# Patient Record
Sex: Male | Born: 1965
Health system: Southern US, Community
[De-identification: ages and names within clinical notes are randomized; demographics above are authoritative.]

## PROBLEM LIST (undated history)

## (undated) DIAGNOSIS — F329 Major depressive disorder, single episode, unspecified: Secondary | ICD-10-CM

## (undated) DIAGNOSIS — H6690 Otitis media, unspecified, unspecified ear: Secondary | ICD-10-CM

## (undated) DIAGNOSIS — K921 Melena: Secondary | ICD-10-CM

## (undated) DIAGNOSIS — K4090 Unilateral inguinal hernia, without obstruction or gangrene, not specified as recurrent: Secondary | ICD-10-CM

## (undated) DIAGNOSIS — K219 Gastro-esophageal reflux disease without esophagitis: Secondary | ICD-10-CM

## (undated) DIAGNOSIS — K589 Irritable bowel syndrome without diarrhea: Secondary | ICD-10-CM

## (undated) DIAGNOSIS — M549 Dorsalgia, unspecified: Secondary | ICD-10-CM

## (undated) DIAGNOSIS — G43009 Migraine without aura, not intractable, without status migrainosus: Secondary | ICD-10-CM

## (undated) DIAGNOSIS — J019 Acute sinusitis, unspecified: Secondary | ICD-10-CM

## (undated) DIAGNOSIS — J069 Acute upper respiratory infection, unspecified: Secondary | ICD-10-CM

## (undated) DIAGNOSIS — F411 Generalized anxiety disorder: Secondary | ICD-10-CM

## (undated) DIAGNOSIS — E785 Hyperlipidemia, unspecified: Secondary | ICD-10-CM

## (undated) DIAGNOSIS — J309 Allergic rhinitis, unspecified: Secondary | ICD-10-CM

## (undated) HISTORY — DX: Dorsalgia, unspecified: M54.9

## (undated) HISTORY — DX: Acute upper respiratory infection, unspecified: J06.9

## (undated) HISTORY — DX: Gastro-esophageal reflux disease without esophagitis: K21.9

## (undated) HISTORY — DX: Unilateral inguinal hernia, without obstruction or gangrene, not specified as recurrent: K40.90

## (undated) HISTORY — DX: Melena: K92.1

## (undated) HISTORY — DX: Otitis media, unspecified, unspecified ear: H66.90

## (undated) HISTORY — DX: Hyperlipidemia, unspecified: E78.5

## (undated) HISTORY — PX: OTHER SURGICAL HISTORY: SHX169

## (undated) HISTORY — DX: Major depressive disorder, single episode, unspecified: F32.9

## (undated) HISTORY — DX: Acute sinusitis, unspecified: J01.90

## (undated) HISTORY — DX: Irritable bowel syndrome without diarrhea: K58.9

## (undated) HISTORY — DX: Allergic rhinitis, unspecified: J30.9

## (undated) HISTORY — DX: Generalized anxiety disorder: F41.1

## (undated) HISTORY — DX: Migraine without aura, not intractable, without status migrainosus: G43.009

---

## 2003-03-08 ENCOUNTER — Emergency Department (HOSPITAL_COMMUNITY): Admission: EM | Admit: 2003-03-08 | Discharge: 2003-03-08 | Payer: Self-pay | Admitting: *Deleted

## 2004-07-28 ENCOUNTER — Ambulatory Visit: Payer: Self-pay | Admitting: Internal Medicine

## 2004-08-03 ENCOUNTER — Ambulatory Visit: Payer: Self-pay | Admitting: Internal Medicine

## 2004-09-02 ENCOUNTER — Ambulatory Visit: Payer: Self-pay | Admitting: Internal Medicine

## 2004-11-20 ENCOUNTER — Ambulatory Visit: Payer: Self-pay | Admitting: Internal Medicine

## 2004-12-16 ENCOUNTER — Ambulatory Visit: Payer: Self-pay | Admitting: Internal Medicine

## 2004-12-16 ENCOUNTER — Ambulatory Visit: Payer: Self-pay | Admitting: Cardiology

## 2005-01-01 ENCOUNTER — Ambulatory Visit: Payer: Self-pay | Admitting: Internal Medicine

## 2005-02-12 ENCOUNTER — Ambulatory Visit: Payer: Self-pay | Admitting: Internal Medicine

## 2005-04-13 ENCOUNTER — Ambulatory Visit: Payer: Self-pay | Admitting: Internal Medicine

## 2005-09-21 ENCOUNTER — Ambulatory Visit: Payer: Self-pay | Admitting: Internal Medicine

## 2006-02-11 ENCOUNTER — Ambulatory Visit: Payer: Self-pay | Admitting: Internal Medicine

## 2006-05-18 ENCOUNTER — Ambulatory Visit: Payer: Self-pay | Admitting: Internal Medicine

## 2006-11-09 ENCOUNTER — Ambulatory Visit: Payer: Self-pay | Admitting: Internal Medicine

## 2008-02-13 ENCOUNTER — Emergency Department (HOSPITAL_COMMUNITY): Admission: EM | Admit: 2008-02-13 | Discharge: 2008-02-13 | Payer: Self-pay | Admitting: Emergency Medicine

## 2008-02-14 ENCOUNTER — Ambulatory Visit: Payer: Self-pay | Admitting: Internal Medicine

## 2008-02-14 DIAGNOSIS — F329 Major depressive disorder, single episode, unspecified: Secondary | ICD-10-CM

## 2008-02-14 DIAGNOSIS — E785 Hyperlipidemia, unspecified: Secondary | ICD-10-CM

## 2008-02-14 DIAGNOSIS — K4090 Unilateral inguinal hernia, without obstruction or gangrene, not specified as recurrent: Secondary | ICD-10-CM | POA: Insufficient documentation

## 2008-02-14 DIAGNOSIS — J309 Allergic rhinitis, unspecified: Secondary | ICD-10-CM

## 2008-02-14 DIAGNOSIS — K589 Irritable bowel syndrome without diarrhea: Secondary | ICD-10-CM | POA: Insufficient documentation

## 2008-02-14 DIAGNOSIS — F411 Generalized anxiety disorder: Secondary | ICD-10-CM

## 2008-02-14 DIAGNOSIS — K219 Gastro-esophageal reflux disease without esophagitis: Secondary | ICD-10-CM | POA: Insufficient documentation

## 2008-02-14 DIAGNOSIS — F32A Depression, unspecified: Secondary | ICD-10-CM | POA: Insufficient documentation

## 2008-02-14 DIAGNOSIS — F3289 Other specified depressive episodes: Secondary | ICD-10-CM

## 2008-02-14 DIAGNOSIS — J3089 Other allergic rhinitis: Secondary | ICD-10-CM | POA: Insufficient documentation

## 2008-02-14 HISTORY — DX: Unilateral inguinal hernia, without obstruction or gangrene, not specified as recurrent: K40.90

## 2008-02-14 HISTORY — DX: Gastro-esophageal reflux disease without esophagitis: K21.9

## 2008-02-14 HISTORY — DX: Other specified depressive episodes: F32.89

## 2008-02-14 HISTORY — DX: Generalized anxiety disorder: F41.1

## 2008-02-14 HISTORY — DX: Allergic rhinitis, unspecified: J30.9

## 2008-02-14 HISTORY — DX: Major depressive disorder, single episode, unspecified: F32.9

## 2008-02-14 HISTORY — DX: Irritable bowel syndrome, unspecified: K58.9

## 2008-02-14 HISTORY — DX: Hyperlipidemia, unspecified: E78.5

## 2008-03-06 ENCOUNTER — Telehealth: Payer: Self-pay | Admitting: Internal Medicine

## 2008-03-22 ENCOUNTER — Ambulatory Visit (HOSPITAL_BASED_OUTPATIENT_CLINIC_OR_DEPARTMENT_OTHER): Admission: RE | Admit: 2008-03-22 | Discharge: 2008-03-22 | Payer: Self-pay | Admitting: General Surgery

## 2008-04-10 ENCOUNTER — Encounter: Payer: Self-pay | Admitting: Internal Medicine

## 2008-05-01 ENCOUNTER — Encounter: Payer: Self-pay | Admitting: Internal Medicine

## 2008-08-07 ENCOUNTER — Telehealth (INDEPENDENT_AMBULATORY_CARE_PROVIDER_SITE_OTHER): Payer: Self-pay | Admitting: *Deleted

## 2008-08-27 ENCOUNTER — Ambulatory Visit: Payer: Self-pay | Admitting: Internal Medicine

## 2008-08-27 DIAGNOSIS — J069 Acute upper respiratory infection, unspecified: Secondary | ICD-10-CM

## 2008-08-27 HISTORY — DX: Acute upper respiratory infection, unspecified: J06.9

## 2008-08-27 LAB — CONVERTED CEMR LAB

## 2008-10-15 ENCOUNTER — Ambulatory Visit: Payer: Self-pay | Admitting: Internal Medicine

## 2009-02-05 ENCOUNTER — Telehealth: Payer: Self-pay | Admitting: Internal Medicine

## 2009-02-18 ENCOUNTER — Ambulatory Visit: Payer: Self-pay | Admitting: Internal Medicine

## 2009-02-18 DIAGNOSIS — G43009 Migraine without aura, not intractable, without status migrainosus: Secondary | ICD-10-CM

## 2009-02-18 DIAGNOSIS — M549 Dorsalgia, unspecified: Secondary | ICD-10-CM | POA: Insufficient documentation

## 2009-02-18 HISTORY — DX: Dorsalgia, unspecified: M54.9

## 2009-02-18 HISTORY — DX: Migraine without aura, not intractable, without status migrainosus: G43.009

## 2009-07-31 ENCOUNTER — Telehealth: Payer: Self-pay | Admitting: Internal Medicine

## 2009-08-25 ENCOUNTER — Ambulatory Visit: Payer: Self-pay | Admitting: Internal Medicine

## 2009-11-05 ENCOUNTER — Ambulatory Visit: Payer: Self-pay | Admitting: Internal Medicine

## 2009-11-05 DIAGNOSIS — J019 Acute sinusitis, unspecified: Secondary | ICD-10-CM

## 2009-11-05 HISTORY — DX: Acute sinusitis, unspecified: J01.90

## 2010-03-02 ENCOUNTER — Ambulatory Visit: Payer: Self-pay | Admitting: Internal Medicine

## 2010-03-02 DIAGNOSIS — H6690 Otitis media, unspecified, unspecified ear: Secondary | ICD-10-CM | POA: Insufficient documentation

## 2010-03-02 HISTORY — DX: Otitis media, unspecified, unspecified ear: H66.90

## 2010-03-09 ENCOUNTER — Emergency Department (HOSPITAL_COMMUNITY): Admission: EM | Admit: 2010-03-09 | Discharge: 2010-03-11 | Payer: Self-pay | Admitting: Emergency Medicine

## 2010-03-10 ENCOUNTER — Telehealth: Payer: Self-pay | Admitting: Internal Medicine

## 2010-03-10 DIAGNOSIS — F329 Major depressive disorder, single episode, unspecified: Secondary | ICD-10-CM

## 2010-03-11 ENCOUNTER — Inpatient Hospital Stay (HOSPITAL_COMMUNITY): Admission: EM | Admit: 2010-03-11 | Discharge: 2010-03-12 | Payer: Self-pay | Admitting: Psychiatry

## 2010-03-11 ENCOUNTER — Ambulatory Visit: Payer: Self-pay | Admitting: Psychiatry

## 2010-03-13 ENCOUNTER — Ambulatory Visit: Payer: Self-pay | Admitting: Internal Medicine

## 2010-04-29 ENCOUNTER — Ambulatory Visit: Payer: Self-pay | Admitting: Internal Medicine

## 2010-04-29 DIAGNOSIS — K921 Melena: Secondary | ICD-10-CM | POA: Insufficient documentation

## 2010-04-29 HISTORY — DX: Melena: K92.1

## 2010-06-30 ENCOUNTER — Ambulatory Visit: Payer: Self-pay | Admitting: Psychiatry

## 2010-07-07 ENCOUNTER — Telehealth: Payer: Self-pay | Admitting: Internal Medicine

## 2010-08-06 ENCOUNTER — Ambulatory Visit: Payer: Self-pay | Admitting: Internal Medicine

## 2010-09-22 NOTE — Assessment & Plan Note (Signed)
Summary: PER PT TOMORROW APPT-RECTAL BLEEDING X FEW MTHS---ER/IF WORSE...   Vital Signs:  Patient profile:   45 year old male Height:      67 inches Weight:      176.50 pounds BMI:     27.74 O2 Sat:      96 % on Room air Temp:     97.9 degrees F oral Pulse rate:   84 / minute BP sitting:   150 / 100  (left arm) Cuff size:   regular  Vitals Entered By: Zella Ball Ewing CMA (AAMA) (April 29, 2010 3:05 PM)  O2 Flow:  Room air CC: Rectal Bleeding/RE   CC:  Rectal Bleeding/RE.  History of Present Illness: here to f/u;  overall depression better/improved on incr citalopram;  not so overwhelmed and less anxiety;  has  a part time job now that helps keep him busy;  also going to a free group therapy for bipolar and depressed persons ;  no suicidal ideation, no panic;  did have a tarful episode yesterday when thinking about current separation;  today here with 1 yr of recurrent rectal bleeding - BRB on tissue, seems to occur more when he has been constipated and straining at stool and more freq lately;  intermittent bleeding, only on tissue, may have had some dark stools but not sure if blood related;  has hx of diverticulitis and worried if might be related;  also BOTH parents with colon cancer (mother dx mar 2008 at 31yo, father dx - estranged);  does have rectal discomfort but only when bleeding occurs., and hard to get clean. No prior colonoscopy.   lost approx 15 lbs intentionally per pt iwth better diet since his recent involuntary committment episode  Problems Prior to Update: 1)  Otitis Media, Acute, Left  (ICD-382.9) 2)  Sinusitis- Acute-nos  (ICD-461.9) 3)  Back Pain  (ICD-724.5) 4)  Common Migraine  (ICD-346.10) 5)  Uri  (ICD-465.9) 6)  Ibs  (ICD-564.1) 7)  Hyperlipidemia  (ICD-272.4) 8)  Allergic Rhinitis  (ICD-477.9) 9)  Depression  (ICD-311) 10)  Anxiety  (ICD-300.00) 11)  Gerd  (ICD-530.81) 12)  Inguinal Hernia  (ICD-550.90)  Medications Prior to Update: 1)   Citalopram Hydrobromide 40 Mg  Tabs (Citalopram Hydrobromide) .Marland Kitchen.. 1 and 1/2 By Mouth Once Daily 2)  Alprazolam 1 Mg Tabs (Alprazolam) .Marland Kitchen.. 1 By Mouth Four Times Per Day As Needed As Needed  - To Fill March 02, 2010  Current Medications (verified): 1)  Citalopram Hydrobromide 40 Mg  Tabs (Citalopram Hydrobromide) .Marland Kitchen.. 1 and 1/2 By Mouth Once Daily 2)  Alprazolam 1 Mg Tabs (Alprazolam) .Marland Kitchen.. 1 By Mouth Four Times Per Day As Needed As Needed  - To Fill March 02, 2010  Allergies (verified): 1)  ! Pcn 2)  ! * Nexium  Past History:  Past Medical History: Last updated: 03/02/2010 GERD Anxiety Depression Allergic rhinitis Hyperlipidemia IBS borderline HTN migraine  Past Surgical History: Last updated: 08/27/2008 s/p RIH 7/09  Family History: Last updated: 04/29/2010 mother with colon cancer, father with colon cancer lung cancer - grandfather  DM HTN  Social History: Last updated: 04/29/2010 Former Smoker work - Occupational psychologist Married no children Alcohol use-yes Drug use-no  Risk Factors: Smoking Status: quit (02/14/2008)  Family History: Reviewed history from 02/14/2008 and no changes required. mother with colon cancer, father with colon cancer lung cancer - grandfather  DM HTN  Social History: Reviewed history from 03/02/2010 and no changes required. Former Smoker work - Occupational psychologist  Married no children Alcohol use-yes Drug use-no  Review of Systems       all otherwise negative per pt -    Physical Exam  General:  alert and overweight-appearing.   Head:  normocephalic and atraumatic.   Eyes:  vision grossly intact, pupils equal, and pupils round.   Ears:  R ear normal and L ear normal.   Nose:  no external deformity and no nasal discharge.   Mouth:  no gingival abnormalities and pharynx pink and moist.   Neck:  supple and no masses.   Lungs:  normal respiratory effort and normal breath sounds.   Heart:  normal rate and regular rhythm.   Abdomen:   soft, non-tender, and normal bowel sounds.   Rectal:  DRE heme neg;  has 6oclock fissure noted and hemorrhoidal tag Extremities:  no edema, no erythema  Skin:  color normal and no rashes.   Psych:  not depressed appearing and slightly anxious.     Impression & Recommendations:  Problem # 1:  HEMATOCHEZIA (ICD-578.1) prob fissure related, but given the FH should have colonscopy, which he simply cannot afford, and declines;  try anusol HC as needed  Problem # 2:  ANXIETY (ICD-300.00)  His updated medication list for this problem includes:    Citalopram Hydrobromide 40 Mg Tabs (Citalopram hydrobromide) .Marland Kitchen... 1 and 1/2 by mouth once daily    Alprazolam 1 Mg Tabs (Alprazolam) .Marland Kitchen... 1 by mouth four times per day as needed as needed  - to fill March 02, 2010 stable overall by hx and exam, ok to continue meds/tx as is   Complete Medication List: 1)  Citalopram Hydrobromide 40 Mg Tabs (Citalopram hydrobromide) .Marland Kitchen.. 1 and 1/2 by mouth once daily 2)  Alprazolam 1 Mg Tabs (Alprazolam) .Marland Kitchen.. 1 by mouth four times per day as needed as needed  - to fill March 02, 2010 3)  Anusol-hc 25 Mg Supp (Hydrocortisone acetate) .Marland Kitchen.. 1 pr two times a day  Patient Instructions: 1)  Please take all new medications as prescribed 2)  Continue all previous medications as before this visit  3)  Please call to have the colonoscopy ordered when you like 4)  Please schedule a follow-up appointment as needed. Prescriptions: ANUSOL-HC 25 MG SUPP (HYDROCORTISONE ACETATE) 1 pr two times a day  #10 x 1   Entered and Authorized by:   Corwin Levins MD   Signed by:   Corwin Levins MD on 04/29/2010   Method used:   Print then Give to Patient   RxID:   [id number]

## 2010-09-22 NOTE — Assessment & Plan Note (Signed)
Summary: refill rx-lb   Vital Signs:  Patient profile:   45 year old male Height:      66 inches Weight:      190.25 pounds BMI:     30.82 O2 Sat:      95 % on Room air Temp:     97.8 degrees F oral Pulse rate:   71 / minute BP sitting:   118 / 84  (left arm) Cuff size:   regular  Vitals Entered By: Zella Ball Ewing CMA Duncan Dull) (March 02, 2010 9:16 AM)  O2 Flow:  Room air  CC: refill on medications/RE   CC:  refill on medications/RE.  History of Present Illness: all of a sudden gained about 14 lbs since march with increased breads in the diet;  Pt denies CP, sob, doe, wheezing, orthopnea, pnd, worsening LE edema, palps, dizziness or syncope  Pt denies new neuro symptoms such as headache, facial or extremity weakness .  Denies polydipsia, polyuria; declines labs today due to cost.  Does have new left earache for 3 days with fever, general weakness and not feeling well.  No decreased hearing, vertigo or n/v.   Preventive Screening-Counseling & Management      Drug Use:  no.    Problems Prior to Update: 1)  Otitis Media, Acute, Left  (ICD-382.9) 2)  Sinusitis- Acute-nos  (ICD-461.9) 3)  Back Pain  (ICD-724.5) 4)  Common Migraine  (ICD-346.10) 5)  Uri  (ICD-465.9) 6)  Ibs  (ICD-564.1) 7)  Hyperlipidemia  (ICD-272.4) 8)  Allergic Rhinitis  (ICD-477.9) 9)  Depression  (ICD-311) 10)  Anxiety  (ICD-300.00) 11)  Gerd  (ICD-530.81) 12)  Inguinal Hernia  (ICD-550.90)  Medications Prior to Update: 1)  Citalopram Hydrobromide 40 Mg  Tabs (Citalopram Hydrobromide) .Marland Kitchen.. 1 By Mouth Once Daily 2)  Alprazolam 1 Mg Tabs (Alprazolam) .Marland Kitchen.. 1 By Mouth Four Times Per Day As Needed As Needed  - To Fil Sep 14, 2009 3)  Doxycycline Hyclate 100 Mg Caps (Doxycycline Hyclate) .Marland Kitchen.. 1 By Mouth Two Times A Day  Current Medications (verified): 1)  Citalopram Hydrobromide 40 Mg  Tabs (Citalopram Hydrobromide) .Marland Kitchen.. 1 By Mouth Once Daily 2)  Alprazolam 1 Mg Tabs (Alprazolam) .Marland Kitchen.. 1 By Mouth Four Times Per  Day As Needed As Needed  - To Fill March 02, 2010 3)  Doxycycline Hyclate 100 Mg Caps (Doxycycline Hyclate) .Marland Kitchen.. 1 By Mouth Two Times A Day  Allergies (verified): 1)  ! Pcn 2)  ! 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  , mild ill  Head:  normocephalic and atraumatic.   Eyes:  vision grossly intact, pupils equal, and pupils round.   Ears:  left tm with severe erythema, mild bulging ;  right tm ok, canals clear Nose:  nasal dischargemucosal pallor and mucosal edema.   Mouth:  pharyngeal erythema and fair dentition.   Neck:  supple and no masses.   Lungs:  normal respiratory effort and normal breath sounds.   Heart:  normal rate and regular rhythm.   Extremities:  no edema, no erythema    Impression & Recommendations:  Problem # 1:  OTITIS MEDIA, ACUTE, LEFT (ICD-382.9)  His updated medication list for this problem includes:    Doxycycline Hyclate 100 Mg Caps (Doxycycline hyclate) .Marland Kitchen... 1 by mouth two times a day treat as above, f/u any worsening signs or symptoms   Complete Medication List: 1)  Citalopram Hydrobromide 40 Mg Tabs (Citalopram hydrobromide) .Marland Kitchen.. 1 by mouth once daily 2)  Alprazolam 1 Mg Tabs (Alprazolam) .Marland Kitchen.. 1 by mouth four times per  day as needed as needed  - to fill March 02, 2010 3)  Doxycycline Hyclate 100 Mg Caps (Doxycycline hyclate) .Marland Kitchen.. 1 by mouth two times a day  Patient Instructions: 1)  Please take all new medications as prescribed 2)  Continue all previous medications as before this visit  3)  Please schedule a follow-up appointment in 1 year or sooner if needed Prescriptions: CITALOPRAM HYDROBROMIDE 40 MG  TABS (CITALOPRAM HYDROBROMIDE) 1 by mouth once daily  #90 x 3   Entered and Authorized by:   Corwin Levins MD   Signed by:   Corwin Levins MD on 03/02/2010   Method used:   Print then Give to Patient   RxID:   [id number] ALPRAZOLAM 1 MG TABS (ALPRAZOLAM) 1 by mouth four times per day as needed as needed  - to fill March 02, 2010  #120 x 5   Entered and Authorized by:   Corwin Levins MD   Signed by:   Corwin Levins MD on 03/02/2010   Method used:   Print then Give to Patient   RxID:   [id number] DOXYCYCLINE HYCLATE 100 MG CAPS (DOXYCYCLINE HYCLATE) 1 by mouth two times a day  #20 x 0   Entered and Authorized by:   Corwin Levins MD   Signed by:   Corwin Levins MD on 03/02/2010   Method used:   Print then Give to Patient   RxID:   [id number]

## 2010-09-22 NOTE — Progress Notes (Signed)
  Phone Note Other Incoming   Summary of Call: call from MD from [facility]  - pt's wife left him on sun july 17, pt left disturbing messages and pictures with noose like apparatus picture on facebook;  did not overdose on the xanax, but had ETOH binge last eveninv; friends brought him now to ER and he is to be involuntarily admitted;  I agreed with this assessment Initial call taken by: Corwin Levins MD,  March 10, 2010 4:27 PM

## 2010-09-22 NOTE — Assessment & Plan Note (Signed)
Summary: FOLLOW UP-NEED PRESCRIPTIONS REFILLED-LB   Vital Signs:  Patient profile:   45 year old male Height:      67 inches Weight:      176 pounds BMI:     27.67 O2 Sat:      97 % on Room air Temp:     97.8 degrees F oral Pulse rate:   66 / minute BP sitting:   112 / 82  (left arm) Cuff size:   regular  Vitals Entered ByMarland Kitchen Zella Ball Ewing (August 25, 2009 8:13 AM)  O2 Flow:  Room air  Preventive Care Screening     declines flu shot  CC: refill on meds/RE   CC:  refill on meds/RE.  History of Present Illness: no insurance today;  needs refills; feels overall stable without worsening depresive symtpoms or panic, overall good complaince and tolerating meds well.  ;Pt denies CP, sob, doe, wheezing, orthopnea, pnd, worsening LE edema, palps, dizziness or syncope  Pt denies new neuro symptoms such as headache, facial or extremity weakness   Problems Prior to Update: 1)  Back Pain  (ICD-724.5) 2)  Common Migraine  (ICD-346.10) 3)  Uri  (ICD-465.9) 4)  Ibs  (ICD-564.1) 5)  Hyperlipidemia  (ICD-272.4) 6)  Allergic Rhinitis  (ICD-477.9) 7)  Depression  (ICD-311) 8)  Anxiety  (ICD-300.00) 9)  Gerd  (ICD-530.81) 10)  Inguinal Hernia  (ICD-550.90)  Medications Prior to Update: 1)  Citalopram Hydrobromide 40 Mg  Tabs (Citalopram Hydrobromide) .Marland Kitchen.. 1 By Mouth Once Daily 2)  Alprazolam 1 Mg Tabs (Alprazolam) .Marland Kitchen.. 1 By Mouth Four Times Per Day As Needed As Needed  - To Fil Aug 15, 2009  Current Medications (verified): 1)  Citalopram Hydrobromide 40 Mg  Tabs (Citalopram Hydrobromide) .Marland Kitchen.. 1 By Mouth Once Daily 2)  Alprazolam 1 Mg Tabs (Alprazolam) .Marland Kitchen.. 1 By Mouth Four Times Per Day As Needed As Needed  - To Fil Aug 15, 2009  Allergies (verified): 1)  ! Pcn 2)  ! 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   Head:  normocephalic and atraumatic.   Eyes:  vision grossly intact and pupils equal.   Ears:  R ear normal and L ear normal.   Nose:  no external deformity and no nasal discharge.   Mouth:  no gingival abnormalities and pharynx pink and moist.   Neck:  supple and no masses.   Lungs:  normal respiratory effort and no intercostal retractions.   Heart:  normal rate and regular rhythm.   Abdomen:  soft, non-tender, and normal bowel sounds.   Msk:  no joint tenderness and no joint swelling.   Extremities:  no edema, no erythema    Impression & Recommendations:  Problem # 1:  ANXIETY (ICD-300.00)  His updated medication list for this problem includes:    Citalopram Hydrobromide 40 Mg Tabs (Citalopram hydrobromide) .Marland Kitchen... 1 by mouth once daily    Alprazolam 1 Mg Tabs (Alprazolam) .Marland Kitchen... 1 by mouth four times per day as needed as needed  - to fil Aug 15, 2009 stable overall  by hx and exam, ok to continue meds/tx as is   Complete Medication List: 1)  Citalopram Hydrobromide 40 Mg Tabs (Citalopram hydrobromide) .Marland Kitchen.. 1 by mouth once daily 2)  Alprazolam 1 Mg Tabs (Alprazolam) .Marland Kitchen.. 1 by mouth four times per day as needed as needed  - to fil Aug 15, 2009  Patient Instructions: 1)  Continue all previous medications as before this visit  2)  Please schedule a follow-up appointment in 6 months or sooner if needed Prescriptions: ALPRAZOLAM 1 MG TABS (ALPRAZOLAM) 1 by mouth four times per day as needed as needed  - to fil Aug 15, 2009  #120 x 5   Entered and Authorized by:   Corwin Levins MD   Signed by:   Corwin Levins MD on 08/25/2009   Method used:   Print then Give to Patient    RxID:   [id number]

## 2010-09-22 NOTE — Progress Notes (Signed)
Summary: REQ FOR ABX  Phone Note Call from Patient Call back at [facility] Phone [phone]   Caller: Patient Summary of Call: Pt called requesting Rx for sinus infection. Pt states that he cannot afford OV right now. Please advise [facility] Initial call taken by: Margaret Pyle, CMA,  July 07, 2010 9:32 AM  Follow-up for Phone Call        sorry, office policy is for OV to determine if antibx are needed  please consider urgent care or ER Follow-up by: Corwin Levins MD,  July 07, 2010 12:13 PM  Additional Follow-up for Phone Call Additional follow up Details #1::        Pt informed  Additional Follow-up by: Lamar Sprinkles, CMA,  July 07, 2010 2:53 PM

## 2010-09-22 NOTE — Assessment & Plan Note (Signed)
Summary: ??sinus inf/cd   Vital Signs:  Patient profile:   45 year old male Height:      66 inches Weight:      176 pounds BMI:     28.51 O2 Sat:      96 % on Room air Temp:     97.4 degrees F oral Pulse rate:   76 / minute BP sitting:   130 / 90  (left arm) Cuff size:   regular  Vitals Entered ByZella Ball Ewing (November 05, 2009 4:05 PM)  O2 Flow:  Room air CC: Sinus congestion and pressure/RE   CC:  Sinus congestion and pressure/RE.  History of Present Illness: here with 2 days onset midl to mod facial pain (worse to the glabellar region), fever, greenish d/c and pressure with mild ST;  Pt denies CP, sob, doe, wheezing, orthopnea, pnd, worsening LE edema, palps, dizziness or syncope   Problems Prior to Update: 1)  Sinusitis- Acute-nos  (ICD-461.9) 2)  Back Pain  (ICD-724.5) 3)  Common Migraine  (ICD-346.10) 4)  Uri  (ICD-465.9) 5)  Ibs  (ICD-564.1) 6)  Hyperlipidemia  (ICD-272.4) 7)  Allergic Rhinitis  (ICD-477.9) 8)  Depression  (ICD-311) 9)  Anxiety  (ICD-300.00) 10)  Gerd  (ICD-530.81) 11)  Inguinal Hernia  (ICD-550.90)  Medications Prior to Update: 1)  Citalopram Hydrobromide 40 Mg  Tabs (Citalopram Hydrobromide) .Marland Kitchen.. 1 By Mouth Once Daily 2)  Alprazolam 1 Mg Tabs (Alprazolam) .Marland Kitchen.. 1 By Mouth Four Times Per Day As Needed As Needed  - To Fil Sep 14, 2009  Current Medications (verified): 1)  Citalopram Hydrobromide 40 Mg  Tabs (Citalopram Hydrobromide) .Marland Kitchen.. 1 By Mouth Once Daily 2)  Alprazolam 1 Mg Tabs (Alprazolam) .Marland Kitchen.. 1 By Mouth Four Times Per Day As Needed As Needed  - To Fil Sep 14, 2009 3)  Doxycycline Hyclate 100 Mg Caps (Doxycycline Hyclate) .Marland Kitchen.. 1 By Mouth Two Times A Day  Allergies (verified): 1)  ! Pcn 2)  ! 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  , mild ill  Head:  normocephalic and atraumatic.   Eyes:  vision grossly intact, pupils equal, and pupils round.   Ears:  bilat tm's red, sinus marked tender glabellar region adn less so the max sinus areas Nose:  nasal dischargemucosal pallor and mucosal edema.   Mouth:  pharyngeal erythema and fair dentition.   Neck:  supple and cervical lymphadenopathy.   Lungs:  normal respiratory effort and normal breath sounds.   Heart:  normal rate and regular rhythm.   Extremities:  no edema, no erythema    Impression & Recommendations:  Problem # 1:  SINUSITIS- ACUTE-NOS (ICD-461.9)  His updated medication list for this problem includes:    Doxycycline Hyclate 100 Mg Caps (Doxycycline hyclate) .Marland Kitchen... 1 by mouth two times a day treat as above, f/u any worsening signs or symptoms   Problem # 2:  ALLERGIC RHINITIS (ICD-477.9) for OTC claritin as needed   Complete Medication List: 1)  Citalopram Hydrobromide 40 Mg Tabs (Citalopram hydrobromide) .Marland Kitchen.. 1 by  mouth once daily 2)  Alprazolam 1 Mg Tabs (Alprazolam) .Marland Kitchen.. 1 by mouth four times per day as needed as needed  - to fil Sep 14, 2009 3)  Doxycycline Hyclate 100 Mg Caps (Doxycycline hyclate) .Marland Kitchen.. 1 by mouth two times a day  Patient Instructions: 1)  Please take all new medications as prescribed 2)  Continue all previous medications as before this visit  3)  You can also use Mucinex OTC or it's generic for congestion  4)  Please schedule a follow-up appointment as needed. Prescriptions: DOXYCYCLINE HYCLATE 100 MG CAPS (DOXYCYCLINE HYCLATE) 1 by mouth two times a day  #20 x 0   Entered and Authorized by:   Corwin Levins MD   Signed by:   Corwin Levins MD on 11/05/2009    Method used:   Print then Give to Patient   RxID:   [id number]
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
## 2010-11-07 LAB — RAPID URINE DRUG SCREEN, HOSP PERFORMED
Amphetamines: NOT DETECTED
Benzodiazepines: POSITIVE — AB
Cocaine: NOT DETECTED
Tetrahydrocannabinol: POSITIVE — AB

## 2010-11-07 LAB — BASIC METABOLIC PANEL
Calcium: 9.4 mg/dL (ref 8.4–10.5)
GFR calc non Af Amer: >60 mL/min (ref >60)
Potassium: 3.8 mEq/L (ref 3.5–5.1)
Sodium: 143 mEq/L (ref 135–145)

## 2010-11-07 LAB — DIFFERENTIAL
Basophils Absolute: 0.2 10*3/uL — ABNORMAL HIGH (ref 0.0–0.1)
Basophils Relative: 2 % — ABNORMAL HIGH (ref 0–1)
Eosinophils Absolute: 0.1 10*3/uL (ref 0.0–0.7)
Eosinophils Relative: 1 % (ref 0–5)
Lymphs Abs: 3.5 10*3/uL (ref 0.7–4.0)

## 2010-11-07 LAB — URINALYSIS, ROUTINE W REFLEX MICROSCOPIC
Glucose, UA: NEGATIVE mg/dL
Nitrite: NEGATIVE
Protein, ur: NEGATIVE mg/dL
Urobilinogen, UA: 0.2 mg/dL (ref 0.0–1.0)

## 2010-11-07 LAB — CBC
Hemoglobin: 16.6 g/dL (ref 13.0–17.0)
MCH: 31.8 pg (ref 26.0–34.0)
MCHC: 34.4 g/dL (ref 30.0–36.0)
Platelets: 300 10*3/uL (ref 150–400)
RBC: 5.21 MIL/uL (ref 4.22–5.81)
WBC: 10.3 10*3/uL (ref 4.0–10.5)

## 2011-01-05 NOTE — Op Note (Signed)
NAME:  Zachary Riley, DAIS NO.:  [id number]   MEDICAL RECORD NO.:  [id number]          PATIENT TYPE:  AMB   LOCATION:  DSC                          FACILITY:  [facility]   PHYSICIAN:  Gabrielle Dare. Janee Morn, M.D.DATE OF BIRTH:  12-19-1965   DATE OF PROCEDURE:  DATE OF DISCHARGE:                               OPERATIVE REPORT   PREOPERATIVE DIAGNOSIS:  Right inguinal hernia.   POSTOPERATIVE DIAGNOSIS:  Right inguinal hernia.   PROCEDURE:  Repair of right inguinal hernia with mesh.   SURGEON:  Gabrielle Dare. Janee Morn, MD   ANESTHESIA:  General.   HISTORY OF PRESENT ILLNESS:  Mr. Zachary Riley is a 45 year old gentleman who  had an acute onset of right inguinal pain and development of a lump the  last month.  I evaluated him in the office for a suspected hernia and  found a right inguinal hernia.  It tends to spontaneously reduce, but he  has had some intermittent pain over the past few weeks.  He presents  today for elective repair.   PROCEDURE IN DETAIL:  Informed consent was obtained.  The patient was  identified in the preop holding area.  His site was marked.  He received  intravenous antibiotics.  He was brought to the operating room.  General  anesthesia with laryngeal mask airway was administered by the anesthesia  staff.  His lower abdomen and groins were prepped and draped in the  sterile fashion.  Marcaine 0.25% with epinephrine was injected out  towards the right anterior-superior iliac spine and then along the right  inguinal region at the site of pending incision.  Right inguinal  incision was made.  Subcutaneous tissues were dissected down through  Scarpa fascia revealing external oblique fascia.  Hemostasis was  obtained with Bovie cautery.  The external oblique fascia was somewhat  attenuated medially.  It was divided laterally and this was continued  down into the stretched out ring.  The superior leaflet of the external  oblique was dissected free off the  transversalis and the inferior  leaflet was dissected down, revealing the shelving edge of inguinal  ligament.  Cord structures were encircled with a Penrose drain.  The  patient did have the inguinal branch with ilioinguinal nerve which was  in a location that was in danger of having entrapment with the mesh, so  this was not primarily divided and the segment was removed.  The cord  was then dissected.  There was a small indirect hernia sac.  This was  dissected down to the base where it exited at the internal ring and all  contents was ensured to be reduced and it was high ligated with a 2-0  Vicryl suture and excised.  The floor was a little bit weak, but there  was no large direct hernia present.  The hernia repair was then  completed with a polypropylene keyhole mesh to cut to size.  This was  secured medially to the tissues over the pubic tubercle with 2-0 Prolene  and it was secured in a running fashion along the shelving edge of  inguinal ligament  inferiorly with running 2-0 Prolene.  It was secured  then with interrupted 2-0 Prolene along the tissues over the pubic  tubercle superiorly and then extending out superiorly and laterally  along and primarily sewing the mesh to the transversalis.  The 2  leaflets of the mesh were rejoined reconstructing his ring.  They were  stitched together into the underlying musculature.  The mesh did extend  out laterally beneath the external oblique fascia.  These 2 leaflets  were tacked together and down to the underlying musculature with several  interrupted 2-0 Prolene sutures.  Meticulous hemostasis was obtained.  Additional local anesthetic was injected.  The external oblique was then  closed with a running 2-0 Vicryl suture.  The subcutaneous tissues were  irrigated.  Hemostasis was again ensured.  Scarpa fascia was closed with  interrupted 3-0 Vicryl sutures.  The skin was closed with running 4-0  Monocryl subcuticular stitch.  Sponge,  needle, and  instrument counts were all correct.  Benzoin, Steri-Strips, and sterile  dressings were applied.  The patient tolerated the procedure well  without apparent complication.  His right testicle was returned to  anatomic position in his scrotum at the completion of the procedure, and  he was taken to the recovery room in stable condition.      Gabrielle Dare Janee Morn, M.D.  Electronically Signed     BET/MEDQ  D:  03/22/2008  T:  03/23/2008  Job:  [id number]   cc:   Corwin Levins, MD

## 2011-01-08 ENCOUNTER — Encounter: Payer: Self-pay | Admitting: Internal Medicine

## 2011-01-08 ENCOUNTER — Ambulatory Visit (INDEPENDENT_AMBULATORY_CARE_PROVIDER_SITE_OTHER): Payer: Self-pay | Admitting: Internal Medicine

## 2011-01-08 VITALS — BP 120/88 | HR 74 | Temp 97.5°F | Ht 67.0 in | Wt 167.2 lb

## 2011-01-08 DIAGNOSIS — F411 Generalized anxiety disorder: Secondary | ICD-10-CM

## 2011-01-08 DIAGNOSIS — F329 Major depressive disorder, single episode, unspecified: Secondary | ICD-10-CM

## 2011-01-08 MED ORDER — ALPRAZOLAM 1 MG PO TABS: 1.0000 mg | ORAL_TABLET | Freq: Four times a day (QID) | ORAL | Status: DC | PRN

## 2011-01-08 MED ORDER — CITALOPRAM HYDROBROMIDE 40 MG PO TABS: ORAL_TABLET | ORAL | Status: DC

## 2011-01-08 NOTE — Assessment & Plan Note (Signed)
[facility]                             PULMONARY OFFICE NOTE   ASHTEN, PRATS                      MRN:          [id number]  DATE:10/30/2006                            DOB:          04-28-1966    HISTORY OF PRESENT ILLNESS:  A 45 year old white male with multiple  complaints today. He was seen at an urgent care for a 1 month history of  persistent sensation of sore throat, for which he was placed on a Z-  pack, which did not good. It turns out that he ran out of Nasacort,  for which he was supposed to be maintaining himself on a daily basis  with the option to increase to b.i.d. for exacerbations with Afrin use  for the first 5 days (see previous discussion of this on December 16, 2004.) He is not following his instructions. He has been coughing up  every morning, brown sputum but having no fever or dyspnea, pleuritic  pain, orthopnea, PND, or leg swelling, or overt reflux symptoms (he  states he is taking Aciphex regularly.)   PHYSICAL EXAMINATION:  GENERAL:  He is an anxious white male in no acute  distress.  VITAL SIGNS:  Afebrile.  Blood pressure 134/96. Hemoglobin saturation  97% on room air.  HEENT:  Unremarkable. Oropharynx is clear.  NECK:  Supple without cervical adenopathy or tenderness. Trachea is  midline.  LUNGS:  Fields reveal minimal rhonchi bilaterally  Overall air movement  is adequate.  CARDIOVASCULAR:  Regular rhythm. Without murmur, rub, or gallop.  ABDOMEN:  Soft, benign.  EXTREMITIES:  Warm without calf tenderness, cyanosis, or clubbing.   IMPRESSION/RECOMMENDATIONS:  1. Poorly controlled rhinitis, now with possible sinusitis. This is      related to post-nasal drip syndrome and chronic sore throat and      hoarseness. I recommended Augmentin 875 b.i.d. for 10 days, Mucinex      DM b.i.d. p.r.n. cough, and Advair Cold & Sinus p.r.n.      congestion. I have also advised him to have a sinus CT scan if      not improved  by the end of 10 days and gave him a phone number to      call.  2. Chronic anxiety. The record indicates that he is supposed to be on      Citalopram 40 mg 1 daily with p.r.n. Xanax but actually, he has      been using Xanax up to 4 times daily on a regular basis and I don't      see any requests for refills for the citalopram.  I have given him      a limited prescription and will ask him to identify a psychologist      from his insurance plan to consider referring him.  3. Chronic left ear ulceration. I recommended dermatology followup but      will need to arrange for this through his insurance plan.  4. Situational hypertension. I have advised the patient to return for      followup within the next 3 months for  comprehensive health care      evaluation. Will see him sooner if needed.     Charlaine Dalton. Sherene Sires, MD, [facility]  Electronically Signed    MBW/MedQ  DD: 11/09/2006  DT: 11/09/2006  Job #: [id number]

## 2011-01-08 NOTE — Assessment & Plan Note (Signed)
[facility]                               PULMONARY OFFICE NOTE   DELNO, BLAISDELL                      MRN:          [id number]  DATE:05/18/2006                            DOB:          1965-12-29    HISTORY:  This is a 45 year old white male last seen here for anxiety and  depression related to his mother's death, but comes in today acutely ill for  a week with nasal congestion with a stuffy head sensation, right ear pain,  and decreased hearing on the right.  He denies any purulent secretions, sore  throat, fever, headache, chest pain, myalgias, or arthralgias.   The chart indicates he is allergic to penicillin.  He tells me this caused  him to be sick on his stomach, but subsequently was able to take  ampicillin without difficulty.   MEDICATIONS:  Reviewed with him in detail and on the face sheet dated  May 18, 2006.  Note:  He is not consistent about using Nasacort or  AcipHex due to insurance issues.   PHYSICAL EXAMINATION:  He is a pleasant ambulatory white male in no acute  distress.  He had stable vital signs.  HEENT:  Severe turbinate edema bilaterally, right greater than left.  He  also has right greater than left severe external otitis.  Neck supple  without cervical adenopathy or tenderness.  Dentition is intact.  LUNGS:  The lung fields are clear bilaterally to auscultation and  percussion.  CARDIAC:  Regular rate and rhythm without murmur, gallop, or rub.  ABDOMEN:  Soft, benign.  EXTREMITIES:  Warm without calf tenderness, cyanosis, clubbing, or edema.   IMPRESSION:  Extended discussion with this patient lasting 15-20 minutes of  a 25 minute visit on the following topics.  1. First, he carries the diagnosis of chronic rhinitis and is not      consistent about using Nasacort and has not instituted the p.r.n. list      of medicines for acute nasal congestion.  I reviewed these with him      again today in detail,  including the use of Afrin and Advil Cold and      Sinus for decongesting purposes.  I gave him samples of Nasacort and      also prescription for a year.  2. A history of reflux for which he has been maintained on AcipHex at Dr.      Ardell Isaacs recommendation.  I gave him refills for this.  I emphasized      that reflux may actually contribute to sinus disease and needs to be      treated as maintenance therapy as well per Dr. Ardell Isaacs recommendation.  3. He has acute external otitis.  I have given him recommendations      regarding ear hygiene and recommended Corticosporin otic suspension 2      drops q.i.d.  4. Finally, he does appear to have possible early sinusitis with a      question of penicillin allergy.  He assures me he is able to take  amoxicillin without difficulty, and therefore, we gave him      Augmentin 875 b.i.d. for 10 days and then sinus CT scan if not 100%      back to baseline in terms of all of his acute complaints.            ______________________________  Charlaine Dalton. Sherene Sires, MD, [facility]      MBW/MedQ  DD:  05/18/2006  DT:  05/19/2006  Job #:  [id number]

## 2011-01-08 NOTE — Patient Instructions (Signed)
Continue all other medications as before Please return in 1 year for your yearly visit, or sooner if needed 

## 2011-01-08 NOTE — Progress Notes (Signed)
  Subjective:    Patient ID: Zachary Riley, male    DOB: 1966-02-03, 45 y.o.   MRN: [id number]  HPI Here to f/u;  States he is wondering if he has a splinter wooden to the web space left hand between thumb and finger after doing some wood working.  Some uncomfortable but not sure he can actually feel a splinter.  Pt denies chest pain, increased sob or doe, wheezing, orthopnea, PND, increased LE swelling, palpitations, dizziness or syncope.  Pt denies new neurological symptoms such as new headache, or facial or extremity weakness or numbness   Pt denies polydipsia, polyuria, Wt down from 186 to 167 with better diet and exercise since July 2011.  No smoking over 10 yrs.  Wife left last summer, for finalize divorce this summer. Denies worsening depressive symptoms, suicidal ideation, or panic, though has ongoing anxiety, not increased recently.  Past Medical History  Diagnosis Date  . ALLERGIC RHINITIS 02/14/2008  . ANXIETY 02/14/2008  . BACK PAIN 02/18/2009  . COMMON MIGRAINE 02/18/2009  . DEPRESSION 02/14/2008  . GERD 02/14/2008  . HEMATOCHEZIA 04/29/2010  . HYPERLIPIDEMIA 02/14/2008  . IBS 02/14/2008  . INGUINAL HERNIA 02/14/2008  . OTITIS MEDIA, ACUTE, LEFT 03/02/2010  . SINUSITIS- ACUTE-NOS 11/05/2009  . URI 08/27/2008   Past Surgical History  Procedure Date  . S/p rih 7/09     reports that he has quit smoking. He does not have any smokeless tobacco history on file. He reports that he drinks alcohol. He reports that he does not use illicit drugs. family history includes Cancer in his father, mother, and other; Diabetes in his other; and Hypertension in his other. Allergies  Allergen Reactions  . Esomeprazole Magnesium     REACTION: per patient  . Penicillins     REACTION: GI upset   No current outpatient prescriptions on file prior to visit.   Review of Systems All otherwise neg per pt     Objective:   Physical Exam BP 120/88  Pulse 74  Temp(Src) 97.5 F (36.4 C) (Oral)  Ht 5\' 7"   (1.702 m)  Wt 167 lb 4 oz (75.864 kg)  BMI 26.20 kg/m2  SpO2 98% Physical Exam  VS noted Constitutional: Pt appears well-developed and well-nourished.  HENT: Head: Normocephalic.  Right Ear: External ear normal.  Left Ear: External ear normal.  Eyes: Conjunctivae and EOM are normal. Pupils are equal, round, and reactive to light.  Neck: Normal range of motion. Neck supple.  Cardiovascular: Normal rate and regular rhythm.   Pulmonary/Chest: Effort normal and breath sounds normal.  Abd:  Soft, NT, non-distended, + BS Neurological: Pt is alert. No cranial nerve deficit.  Skin: Skin is warm. No erythema.  No spllinter palpable to skin left hand web space Psychiatric: Pt behavior is normal. Thought content normal. 1+ nervous, not depressed appearing        Assessment & Plan:

## 2011-01-09 ENCOUNTER — Encounter: Payer: Self-pay | Admitting: Internal Medicine

## 2011-01-09 NOTE — Assessment & Plan Note (Signed)
stable overall by hx and exam, most recent lab reviewed with pt, and pt to continue medical treatment as before 

## 2011-01-09 NOTE — Assessment & Plan Note (Signed)
stable overall by hx and exam, most recent lab reviewed with pt, and pt to continue medical treatment as before  Lab Results  Component Value Date   WBC 10.3 03/09/2010   HGB 16.6 03/09/2010   HCT 48.2 03/09/2010   PLT 300 03/09/2010   NA 143 03/09/2010   K 3.8 03/09/2010   CL 108 03/09/2010   CREATININE 0.92 03/09/2010   BUN 10 03/09/2010   CO2 24 03/09/2010   PSA declined 08/27/2008

## 2011-02-16 ENCOUNTER — Other Ambulatory Visit (INDEPENDENT_AMBULATORY_CARE_PROVIDER_SITE_OTHER): Payer: Self-pay

## 2011-02-16 ENCOUNTER — Telehealth: Payer: Self-pay

## 2011-02-16 DIAGNOSIS — R635 Abnormal weight gain: Secondary | ICD-10-CM

## 2011-02-16 LAB — CBC WITH DIFFERENTIAL/PLATELET
Basophils Absolute: 0.1 10*3/uL (ref 0.0–0.1)
HCT: 46.9 % (ref 39.0–52.0)
Lymphs Abs: 4.1 10*3/uL — ABNORMAL HIGH (ref 0.7–4.0)
Monocytes Relative: 6.5 % (ref 3.0–12.0)
Neutrophils Relative %: 63.6 % (ref 43.0–77.0)
Platelets: 285 10*3/uL (ref 150.0–400.0)
RDW: 13.5 % (ref 11.5–14.6)
WBC: 14.3 10*3/uL — ABNORMAL HIGH (ref 4.5–10.5)

## 2011-02-16 LAB — BASIC METABOLIC PANEL
CO2: 25 mEq/L (ref 19–32)
Calcium: 9.5 mg/dL (ref 8.4–10.5)
GFR: 107.17 mL/min (ref >60.00)
Potassium: 3.7 mEq/L (ref 3.5–5.1)
Sodium: 146 mEq/L — ABNORMAL HIGH (ref 135–145)

## 2011-02-16 LAB — URINALYSIS
Specific Gravity, Urine: >=1.03 (ref 1.000–1.030)
Urine Glucose: NEGATIVE
pH: 5.5 (ref 5.0–8.0)

## 2011-02-16 LAB — HEPATIC FUNCTION PANEL
AST: 20 U/L (ref 0–37)
Alkaline Phosphatase: 70 U/L (ref 39–117)
Bilirubin, Direct: 0.1 mg/dL (ref 0.0–0.3)

## 2011-02-16 NOTE — Telephone Encounter (Signed)
Pt advised, labs scheduled

## 2011-02-16 NOTE — Telephone Encounter (Signed)
Pt called requesting to have labs done as he declined at last OV. Per pt he has been losing weight and "not feeling himself lately" pt also mentions that both his parents have has cancer. Please advise?

## 2011-02-16 NOTE — Telephone Encounter (Signed)
Ok for labs with respect to recent wt loss:  783.1  Cbc,. Bmet, hepatic panel,UA, TSH

## 2011-02-18 ENCOUNTER — Telehealth: Payer: Self-pay

## 2011-02-18 NOTE — Progress Notes (Signed)
I would recommend OV to try to determine the source of infection, or consider urgent care of ER asap

## 2011-02-18 NOTE — Telephone Encounter (Signed)
Message copied by Pincus Sanes on Thu Feb 18, 2011  1:09 PM ------      Message from: Corwin Levins      Created: Thu Feb 18, 2011 12:55 PM      Regarding: recent feeling ill        Needs OV either here , urgent care or ER asap      ----- Message -----         From: Scharlene Gloss         Sent: 02/18/2011   9:26 AM           To: Oliver Barre, MD            Called the patient informed of results. He informed he was sick last week for 3 days with muscle aches and felt bad. Since then he is better although has been having really dark stools, loss of appetite and just not feeling well.

## 2011-02-18 NOTE — Telephone Encounter (Signed)
Called the patient informed to schedule appt. Asap. Patient has scheduled OV with Dr. Jonny Ruiz 02/19/2011 at 3:30.

## 2011-02-19 ENCOUNTER — Ambulatory Visit (INDEPENDENT_AMBULATORY_CARE_PROVIDER_SITE_OTHER): Payer: Self-pay | Admitting: Internal Medicine

## 2011-02-19 ENCOUNTER — Encounter: Payer: Self-pay | Admitting: Internal Medicine

## 2011-02-19 VITALS — BP 118/80 | HR 79 | Temp 97.9°F | Ht 67.0 in | Wt 166.2 lb

## 2011-02-19 DIAGNOSIS — D72829 Elevated white blood cell count, unspecified: Secondary | ICD-10-CM

## 2011-02-19 DIAGNOSIS — R21 Rash and other nonspecific skin eruption: Secondary | ICD-10-CM | POA: Insufficient documentation

## 2011-02-19 MED ORDER — AZITHROMYCIN 250 MG PO TABS: ORAL_TABLET | ORAL | Status: AC

## 2011-02-19 MED ORDER — NYSTATIN 100000 UNIT/GM EX POWD: CUTANEOUS | Status: DC

## 2011-02-19 NOTE — Assessment & Plan Note (Signed)
Suspect recent mild bronchitis/?pna - pt declines cxr, will tx with zpack,  to f/u any worsening symptoms or concerns

## 2011-02-19 NOTE — Patient Instructions (Addendum)
Take all new medications as prescribed (antibiotic and powder sent to your pharmacy) Continue all other medications as before

## 2011-02-19 NOTE — Progress Notes (Signed)
  Subjective:    Patient ID: Zachary Riley, male    DOB: 1965/12/28, 45 y.o.   MRN: [id number]  HPI  Here after recent feeling poorly, with cough, ? Mild sob, fatigue , myalgias, general weakness and malaise for > 1 wk, with recent lab requested per pt including mild leukocytosis;   Denies urinary symptoms such as dysuria, frequency, urgency,or hematuria.  Pt denies chest pain, increased  doe, wheezing, orthopnea, PND, increased LE swelling, palpitations, dizziness or syncope.  Pt denies new neurological symptoms such as new headache, or facial or extremity weakness or numbness   Pt denies polydipsia, polyuria.   Past Medical History  Diagnosis Date  . ALLERGIC RHINITIS 02/14/2008  . ANXIETY 02/14/2008  . BACK PAIN 02/18/2009  . COMMON MIGRAINE 02/18/2009  . DEPRESSION 02/14/2008  . GERD 02/14/2008  . HEMATOCHEZIA 04/29/2010  . HYPERLIPIDEMIA 02/14/2008  . IBS 02/14/2008  . INGUINAL HERNIA 02/14/2008  . OTITIS MEDIA, ACUTE, LEFT 03/02/2010  . SINUSITIS- ACUTE-NOS 11/05/2009  . URI 08/27/2008   Past Surgical History  Procedure Date  . S/p rih 7/09     reports that he has quit smoking. He does not have any smokeless tobacco history on file. He reports that he drinks alcohol. He reports that he does not use illicit drugs. family history includes Cancer in his father, mother, and other; Diabetes in his other; and Hypertension in his other. Allergies  Allergen Reactions  . Esomeprazole Magnesium     REACTION: per patient  . Penicillins     REACTION: GI upset   Current Outpatient Prescriptions on File Prior to Visit  Medication Sig Dispense Refill  . ALPRAZolam (XANAX) 1 MG tablet Take 1 tablet (1 mg total) by mouth 4 (four) times daily as needed.  120 tablet  5  . citalopram (CELEXA) 40 MG tablet 1 and 1/2 by mouth once daily  135 tablet  3   Review of Systems Review of Systems  Constitutional: Negative for diaphoresis and unexpected weight change.  HENT: Negative for drooling and tinnitus.     Eyes: Negative for photophobia and visual disturbance.  Respiratory: Negative for choking and stridor.   Gastrointestinal: Negative for vomiting and blood in stool.  Genitourinary: Negative for hematuria and decreased urine volume.  Musculoskeletal: Negative for gait problem.  Skin: Negative for color change and wound. but does signficant mild itchy rash to bilat groin and medial thigh areas, sweats at work daily   Objective:   Physical Exam BP 118/80  Pulse 79  Temp(Src) 97.9 F (36.6 C) (Oral)  Ht 5\' 7"  (1.702 m)  Wt 166 lb 4 oz (75.411 kg)  BMI 26.04 kg/m2  SpO2 97% Physical Exam  VS noted, mild ill Constitutional: Pt appears well-developed and well-nourished.  HENT: Head: Normocephalic.  Right Ear: External ear normal.  Left Ear: External ear normal.  Bilat tm's mild erythema.  Sinus nontender.  Pharynx mild erythema Eyes: Conjunctivae and EOM are normal. Pupils are equal, round, and reactive to light.  Neck: Normal range of motion. Neck supple.  Cardiovascular: Normal rate and regular rhythm.   Pulmonary/Chest: Effort normal and breath sounds normal.  Abd:  Soft, NT, non-distended, + BS Neurological: Pt is alert. No cranial nerve deficit.  Skin: Skin is warm. No erythema. except for somewhat scaly rash nontender to bilat inguinal and medial thigh areas Psychiatric: Pt behavior is normal. Thought content normal.         Assessment & Plan:

## 2011-02-19 NOTE — Assessment & Plan Note (Signed)
Groin c/w candida  - for nystatin powder prn

## 2011-05-20 LAB — URINALYSIS, ROUTINE W REFLEX MICROSCOPIC
Glucose, UA: NEGATIVE
Hgb urine dipstick: NEGATIVE
Ketones, ur: NEGATIVE
Protein, ur: NEGATIVE

## 2011-05-20 LAB — DIFFERENTIAL
Eosinophils Absolute: 0.2
Eosinophils Relative: 2
Lymphs Abs: 3.1
Monocytes Absolute: 0.5
Monocytes Relative: 5

## 2011-05-20 LAB — URINE CULTURE: Culture: NO GROWTH

## 2011-05-20 LAB — CBC
HCT: 45.7
Hemoglobin: 15.6
MCV: 92.7
RBC: 4.93
WBC: 9.7

## 2011-07-07 ENCOUNTER — Ambulatory Visit (INDEPENDENT_AMBULATORY_CARE_PROVIDER_SITE_OTHER): Payer: Self-pay | Admitting: Internal Medicine

## 2011-07-07 ENCOUNTER — Encounter: Payer: Self-pay | Admitting: Internal Medicine

## 2011-07-07 VITALS — BP 110/80 | HR 99 | Temp 97.5°F | Ht 67.0 in | Wt 164.4 lb

## 2011-07-07 DIAGNOSIS — D72829 Elevated white blood cell count, unspecified: Secondary | ICD-10-CM

## 2011-07-07 DIAGNOSIS — F411 Generalized anxiety disorder: Secondary | ICD-10-CM

## 2011-07-07 MED ORDER — ALPRAZOLAM 1 MG PO TABS: 1.0000 mg | ORAL_TABLET | Freq: Four times a day (QID) | ORAL | Status: DC | PRN

## 2011-07-07 MED ORDER — CITALOPRAM HYDROBROMIDE 40 MG PO TABS: ORAL_TABLET | ORAL | Status: DC

## 2011-07-07 NOTE — Patient Instructions (Signed)
Continue all other medications as before Please return in 1 year for your yearly visit, or sooner if needed 

## 2011-07-11 ENCOUNTER — Encounter: Payer: Self-pay | Admitting: Internal Medicine

## 2011-07-11 NOTE — Assessment & Plan Note (Signed)
stable overall by hx and exam, most recent data reviewed with pt, and pt to continue medical treatment as before, for med refills,  Lab Results  Component Value Date   WBC 14.3* 02/16/2011   HGB 16.1 02/16/2011   HCT 46.9 02/16/2011   PLT 285.0 02/16/2011   GLUCOSE 86 02/16/2011   ALT 24 02/16/2011   AST 20 02/16/2011   NA 146* 02/16/2011   K 3.7 02/16/2011   CL 110 02/16/2011   CREATININE 0.8 02/16/2011   BUN 22 02/16/2011   CO2 25 02/16/2011   TSH 1.76 02/16/2011   PSA declined 08/27/2008

## 2011-07-11 NOTE — Assessment & Plan Note (Signed)
Mild, exam today benign, pt decliens f/u at this time due to cost

## 2011-07-11 NOTE — Progress Notes (Signed)
  Subjective:    Patient ID: Zachary Riley, male    DOB: 1965/08/25, 45 y.o.   MRN: [id number]  HPI  Here to f/u; overall doing ok,  Pt denies chest pain, increased sob or doe, wheezing, orthopnea, PND, increased LE swelling, palpitations, dizziness or syncope.  Pt denies new neurological symptoms such as new headache, or facial or extremity weakness or numbness   Pt denies polydipsia, polyuria  Pt states overall good compliance with meds, trying to follow lower cholesterol diet, wt overall stable but little exercise however.  Denies worsening depressive symptoms, suicidal ideation, or panic, though has ongoing anxiety, not increased recently. Overall good compliance with treatment, and good medicine tolerability. Past Medical History  Diagnosis Date  . ALLERGIC RHINITIS 02/14/2008  . ANXIETY 02/14/2008  . BACK PAIN 02/18/2009  . COMMON MIGRAINE 02/18/2009  . DEPRESSION 02/14/2008  . GERD 02/14/2008  . HEMATOCHEZIA 04/29/2010  . HYPERLIPIDEMIA 02/14/2008  . IBS 02/14/2008  . INGUINAL HERNIA 02/14/2008  . OTITIS MEDIA, ACUTE, LEFT 03/02/2010  . SINUSITIS- ACUTE-NOS 11/05/2009  . URI 08/27/2008   Past Surgical History  Procedure Date  . S/p rih 7/09     reports that he has quit smoking. He does not have any smokeless tobacco history on file. He reports that he drinks alcohol. He reports that he does not use illicit drugs. family history includes Cancer in his father, mother, and other; Diabetes in his other; and Hypertension in his other. Allergies  Allergen Reactions  . Esomeprazole Magnesium     REACTION: per patient  . Penicillins     REACTION: GI upset   Current Outpatient Prescriptions on File Prior to Visit  Medication Sig Dispense Refill  . nystatin (MYCOSTATIN) powder Apply to affected area 3 times daily  15 g  2   Review of Systems Review of Systems  Constitutional: Negative for diaphoresis and unexpected weight change.  HENT: Negative for drooling and tinnitus.   Eyes: Negative  for photophobia and visual disturbance.  Respiratory: Negative for choking and stridor.   Gastrointestinal: Negative for vomiting and blood in stool.  Genitourinary: Negative for hematuria and decreased urine volume.        Objective:   Physical Exam BP 110/80  Pulse 99  Temp(Src) 97.5 F (36.4 C) (Oral)  Ht 5\' 7"  (1.702 m)  Wt 164 lb 6 oz (74.56 kg)  BMI 25.74 kg/m2  SpO2 97% Physical Exam  VS noted Constitutional: Pt appears well-developed and well-nourished.  HENT: Head: Normocephalic.  Right Ear: External ear normal.  Left Ear: External ear normal.  Eyes: Conjunctivae and EOM are normal. Pupils are equal, round, and reactive to light.  Neck: Normal range of motion. Neck supple.  Cardiovascular: Normal rate and regular rhythm.   Pulmonary/Chest: Effort normal and breath sounds normal.  Abd:  Soft, NT, non-distended, + BS Neurological: Pt is alert. No cranial nerve deficit.  Skin: Skin is warm. No erythema.  Psychiatric: Pt behavior is normal. Thought content normal. 1+ nervous, not depressed affect       Assessment & Plan:

## 2011-12-20 ENCOUNTER — Other Ambulatory Visit: Payer: Self-pay

## 2011-12-20 MED ORDER — ALPRAZOLAM 1 MG PO TABS: 1.0000 mg | ORAL_TABLET | Freq: Four times a day (QID) | ORAL | Status: DC | PRN

## 2011-12-20 MED ORDER — CITALOPRAM HYDROBROMIDE 40 MG PO TABS: ORAL_TABLET | ORAL | Status: DC

## 2011-12-20 NOTE — Telephone Encounter (Signed)
Done hardcopy to [facility]  

## 2011-12-20 NOTE — Telephone Encounter (Signed)
Faxed hardcopy to pharmacy. 

## 2012-06-19 ENCOUNTER — Other Ambulatory Visit: Payer: Self-pay | Admitting: Internal Medicine

## 2012-06-19 NOTE — Telephone Encounter (Signed)
Faxed hardcopy to [facility]. 

## 2012-06-19 NOTE — Telephone Encounter (Signed)
Called the patient informed to schedule ROV, he agreed to do so.

## 2012-06-19 NOTE — Telephone Encounter (Signed)
Done hardcopy to [facility]  Pt due for ROV next month

## 2012-07-17 ENCOUNTER — Encounter: Payer: Self-pay | Admitting: Internal Medicine

## 2012-07-17 ENCOUNTER — Ambulatory Visit (INDEPENDENT_AMBULATORY_CARE_PROVIDER_SITE_OTHER): Payer: Self-pay | Admitting: Internal Medicine

## 2012-07-17 VITALS — BP 110/72 | HR 76 | Temp 97.6°F | Ht 67.0 in | Wt 162.1 lb

## 2012-07-17 DIAGNOSIS — Z0000 Encounter for general adult medical examination without abnormal findings: Secondary | ICD-10-CM

## 2012-07-17 DIAGNOSIS — Z0001 Encounter for general adult medical examination with abnormal findings: Secondary | ICD-10-CM | POA: Insufficient documentation

## 2012-07-17 DIAGNOSIS — F411 Generalized anxiety disorder: Secondary | ICD-10-CM

## 2012-07-17 MED ORDER — ALPRAZOLAM 1 MG PO TABS: 1.0000 mg | ORAL_TABLET | Freq: Four times a day (QID) | ORAL | Status: DC | PRN

## 2012-07-17 MED ORDER — CITALOPRAM HYDROBROMIDE 40 MG PO TABS: ORAL_TABLET | ORAL | Status: DC

## 2012-07-17 NOTE — Assessment & Plan Note (Signed)
stable overall by hx and exam, most recent data reviewed with pt, and pt to continue medical treatment as before Lab Results  Component Value Date   WBC 14.3* 02/16/2011   HGB 16.1 02/16/2011   HCT 46.9 02/16/2011   PLT 285.0 02/16/2011   GLUCOSE 86 02/16/2011   ALT 24 02/16/2011   AST 20 02/16/2011   NA 146* 02/16/2011   K 3.7 02/16/2011   CL 110 02/16/2011   CREATININE 0.8 02/16/2011   BUN 22 02/16/2011   CO2 25 02/16/2011   TSH 1.76 02/16/2011   PSA declined 08/27/2008

## 2012-07-17 NOTE — Progress Notes (Signed)
  Subjective:    Patient ID: SEYDINA HOLLIMAN, male    DOB: August 04, 1966, 46 y.o.   MRN: [id number]  HPI   Here to f/u; overall doing ok,  Pt denies chest pain, increased sob or doe, wheezing, orthopnea, PND, increased LE swelling, palpitations, dizziness or syncope.  Pt denies new neurological symptoms such as new headache, or facial or extremity weakness or numbness   Pt denies polydipsia, polyuria, or low sugar symptoms such as weakness or confusion improved with po intake.  Pt states overall good compliance with meds, trying to follow lower cholesterol diet, wt overall stable but little exercise however.  Denies worsening depressive symptoms, suicidal ideation, or panic, though has ongoing anxiety, not increased recently.  Past Medical History  Diagnosis Date  . ALLERGIC RHINITIS 02/14/2008  . ANXIETY 02/14/2008  . BACK PAIN 02/18/2009  . COMMON MIGRAINE 02/18/2009  . DEPRESSION 02/14/2008  . GERD 02/14/2008  . HEMATOCHEZIA 04/29/2010  . HYPERLIPIDEMIA 02/14/2008  . IBS 02/14/2008  . INGUINAL HERNIA 02/14/2008  . OTITIS MEDIA, ACUTE, LEFT 03/02/2010  . SINUSITIS- ACUTE-NOS 11/05/2009  . URI 08/27/2008   Past Surgical History  Procedure Date  . S/p rih 7/09     reports that he has quit smoking. He does not have any smokeless tobacco history on file. He reports that he drinks alcohol. He reports that he does not use illicit drugs. family history includes Cancer in his father, mother, and other; Diabetes in his other; and Hypertension in his other. Allergies  Allergen Reactions  . Esomeprazole Magnesium     REACTION: per patient  . Penicillins     REACTION: GI upset   Review of Systems All otherwise neg per pt     Objective:   Physical Exam BP 110/72  Pulse 76  Temp 97.6 F (36.4 C) (Oral)  Ht 5\' 7"  (1.702 m)  Wt 162 lb 2 oz (73.539 kg)  BMI 25.39 kg/m2  SpO2 97% Physical Exam  VS noted Constitutional: Pt appears well-developed and well-nourished.  HENT: Head: Normocephalic.  Right  Ear: External ear normal.  Left Ear: External ear normal.  Eyes: Conjunctivae and EOM are normal. Pupils are equal, round, and reactive to light.  Neck: Normal range of motion. Neck supple.  Cardiovascular: Normal rate and regular rhythm.   Pulmonary/Chest: Effort normal and breath sounds normal.  Neurological: Pt is alert. Not confused  Skin: Skin is warm. No erythema.  Psychiatric: Pt behavior is normal. Thought content normal. 1+ nervous    Assessment & Plan:

## 2012-07-17 NOTE — Patient Instructions (Addendum)
Continue all other medications as before Thank you for enrolling in MyChart. Please follow the instructions below to securely access your online medical record. MyChart allows you to send messages to your doctor, view your test results, renew your prescriptions, schedule appointments, and more To Log into MyChart, please go to [email or web address], and your Username is: [email or web address] Please return in 9 mo with Lab testing done 3-5 days before

## 2012-10-07 ENCOUNTER — Other Ambulatory Visit: Payer: Self-pay

## 2012-10-16 ENCOUNTER — Other Ambulatory Visit: Payer: Self-pay | Admitting: Internal Medicine

## 2012-10-16 NOTE — Telephone Encounter (Signed)
Faxed hardcopy to pharmacy. 

## 2012-10-16 NOTE — Telephone Encounter (Signed)
Done hardcopy to [facility]  

## 2012-12-15 ENCOUNTER — Other Ambulatory Visit: Payer: Self-pay | Admitting: Internal Medicine

## 2012-12-15 NOTE — Telephone Encounter (Signed)
Faxed hardcopy to pharmcy

## 2012-12-15 NOTE — Telephone Encounter (Signed)
Done hardcopy to [facility]  

## 2013-01-11 ENCOUNTER — Other Ambulatory Visit: Payer: Self-pay | Admitting: Internal Medicine

## 2013-01-11 NOTE — Telephone Encounter (Signed)
Done hardcopy to [facility]  

## 2013-01-11 NOTE — Telephone Encounter (Signed)
Faxed hardcopy to [facility] [place] 

## 2013-04-17 ENCOUNTER — Encounter: Payer: Self-pay | Admitting: Internal Medicine

## 2013-04-18 ENCOUNTER — Other Ambulatory Visit: Payer: Self-pay | Admitting: Internal Medicine

## 2013-04-18 NOTE — Telephone Encounter (Signed)
Done hardcopy to [facility]  

## 2013-04-18 NOTE — Telephone Encounter (Signed)
Faxed hardcopy to [facility] 

## 2013-05-21 ENCOUNTER — Other Ambulatory Visit: Payer: Self-pay | Admitting: Internal Medicine

## 2013-05-21 NOTE — Telephone Encounter (Signed)
Ok to refill? Last OV 11.25.13 Last filled 8.27.14

## 2013-05-21 NOTE — Telephone Encounter (Signed)
Done hardcopy to [facility]  

## 2013-05-22 NOTE — Telephone Encounter (Signed)
Faxed hardcopy to [facility]. 

## 2013-06-07 ENCOUNTER — Encounter: Payer: Self-pay | Admitting: Internal Medicine

## 2013-06-26 ENCOUNTER — Ambulatory Visit: Payer: Self-pay | Admitting: Internal Medicine

## 2013-06-28 ENCOUNTER — Other Ambulatory Visit: Payer: Self-pay

## 2013-07-03 ENCOUNTER — Ambulatory Visit (INDEPENDENT_AMBULATORY_CARE_PROVIDER_SITE_OTHER): Payer: Self-pay | Admitting: Internal Medicine

## 2013-07-03 ENCOUNTER — Encounter: Payer: Self-pay | Admitting: Internal Medicine

## 2013-07-03 VITALS — BP 110/82 | HR 83 | Temp 98.5°F | Ht 67.0 in | Wt 161.5 lb

## 2013-07-03 DIAGNOSIS — G5601 Carpal tunnel syndrome, right upper limb: Secondary | ICD-10-CM

## 2013-07-03 DIAGNOSIS — G5600 Carpal tunnel syndrome, unspecified upper limb: Secondary | ICD-10-CM | POA: Insufficient documentation

## 2013-07-03 DIAGNOSIS — F411 Generalized anxiety disorder: Secondary | ICD-10-CM

## 2013-07-03 DIAGNOSIS — M79644 Pain in right finger(s): Secondary | ICD-10-CM | POA: Insufficient documentation

## 2013-07-03 DIAGNOSIS — M79609 Pain in unspecified limb: Secondary | ICD-10-CM

## 2013-07-03 MED ORDER — CITALOPRAM HYDROBROMIDE 40 MG PO TABS: ORAL_TABLET | ORAL | Status: DC

## 2013-07-03 MED ORDER — ALPRAZOLAM 1 MG PO TABS: ORAL_TABLET | ORAL | Status: DC

## 2013-07-03 NOTE — Progress Notes (Signed)
  Subjective:    Patient ID: Zachary Riley, male    DOB: 1966/03/03, 47 y.o.   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 Denies worsening depressive symptoms, suicidal ideation, or panic; has ongoing anxiety, not increased recently. Asks for med refils.  Has 3 mo onset also right third finger PIP aching pain, worse at the end of the day, better to rest, better with alleve, uses hands at work frequently, also with right CTS like numbness and aching occas as well. Past Medical History  Diagnosis Date  . ALLERGIC RHINITIS 02/14/2008  . ANXIETY 02/14/2008  . BACK PAIN 02/18/2009  . COMMON MIGRAINE 02/18/2009  . DEPRESSION 02/14/2008  . GERD 02/14/2008  . HEMATOCHEZIA 04/29/2010  . HYPERLIPIDEMIA 02/14/2008  . IBS 02/14/2008  . INGUINAL HERNIA 02/14/2008  . OTITIS MEDIA, ACUTE, LEFT 03/02/2010  . SINUSITIS- ACUTE-NOS 11/05/2009  . URI 08/27/2008   Past Surgical History  Procedure Laterality Date  . S/p rih 7/09      reports that he has quit smoking. He does not have any smokeless tobacco history on file. He reports that he drinks alcohol. He reports that he does not use illicit drugs. family history includes Cancer in his father, mother, and other; Diabetes in his other; Hypertension in his other. Allergies  Allergen Reactions  . Esomeprazole Magnesium     REACTION: per patient  . Penicillins     REACTION: GI upset   No current outpatient prescriptions on file prior to visit.   No current facility-administered medications on file prior to visit.   Review of Systems  Constitutional: Negative for unexpected weight change, or unusual diaphoresis  HENT: Negative for tinnitus.   Eyes: Negative for photophobia and visual disturbance.  Respiratory: Negative for choking and stridor.   Gastrointestinal: Negative for vomiting and blood in stool.  Genitourinary: Negative for hematuria  and decreased urine volume.  Musculoskeletal: Negative for acute joint swelling Skin: Negative for color change and wound.  Neurological: Negative for tremors and numbness other than noted  Psychiatric/Behavioral: Negative for decreased concentration or  hyperactivity.        Objective:   Physical Exam BP 110/82  Pulse 83  Temp(Src) 98.5 F (36.9 C) (Oral)  Ht 5\' 7"  (1.702 m)  Wt 161 lb 8 oz (73.256 kg)  BMI 25.29 kg/m2  SpO2 98% VS noted, mild ill Constitutional: Pt appears well-developed and well-nourished.  HENT: Head: NCAT.  Right Ear: External ear normal.  Left Ear: External ear normal.  Eyes: Conjunctivae and EOM are normal. Pupils are equal, round, and reactive to light.  Neck: Normal range of motion. Neck supple.  Cardiovascular: Normal rate and regular rhythm.   Pulmonary/Chest: Effort normal and breath sounds normal.  Neurological: Pt is alert. Not confused , motor 5/5 Right third finger PIP with mild bony enlargement, trace effusion, but NT, FROM, no erythema Skin: Skin is warm. No erythema. No LE edema Psychiatric: Pt behavior is normal. Thought content normal. 1+ nervous     Assessment & Plan:

## 2013-07-03 NOTE — Assessment & Plan Note (Signed)
Mild symptoms, exam ok , cont current meds , for right wrist splint at night, consider hand surgury referral

## 2013-07-03 NOTE — Assessment & Plan Note (Signed)
C/w OA flare ongoing, for otc capsaicin prn,  to f/u any worsening symptoms or concerns

## 2013-07-03 NOTE — Patient Instructions (Addendum)
OK to use OTC Capsaicin for the third finger joint pain Please continue all other medications as before, and refills have been done if requested. Please have the pharmacy call with any other refills you may need.  Please call for physical at your earliest convenience  Please consider referral to Hand Sug[facility] for possible steroid injection to the joint, and Carpal Tunnel symptoms  Good Luck with getting on with permanent position.

## 2013-07-03 NOTE — Progress Notes (Signed)
Pre-visit discussion using our clinic review tool. No additional management support is needed unless otherwise documented below in the visit note.  

## 2013-07-03 NOTE — Assessment & Plan Note (Signed)
stable overall by history and exam, , and pt to continue medical treatment as before,  to f/u any worsening symptoms or concerns  

## 2013-10-10 ENCOUNTER — Other Ambulatory Visit: Payer: Self-pay | Admitting: Internal Medicine

## 2013-10-10 NOTE — Telephone Encounter (Signed)
Done hardcopy to [facility]  

## 2013-10-10 NOTE — Telephone Encounter (Signed)
[facility] [place]

## 2013-11-05 ENCOUNTER — Other Ambulatory Visit: Payer: Self-pay | Admitting: Internal Medicine

## 2013-11-06 ENCOUNTER — Other Ambulatory Visit: Payer: Self-pay | Admitting: Internal Medicine

## 2013-11-07 NOTE — Telephone Encounter (Signed)
Xanax too soon

## 2013-11-08 ENCOUNTER — Other Ambulatory Visit: Payer: Self-pay | Admitting: Internal Medicine

## 2013-11-21 ENCOUNTER — Ambulatory Visit (INDEPENDENT_AMBULATORY_CARE_PROVIDER_SITE_OTHER): Payer: Managed Care, Other (non HMO) | Admitting: Internal Medicine

## 2013-11-21 ENCOUNTER — Other Ambulatory Visit: Payer: Self-pay | Admitting: Internal Medicine

## 2013-11-21 ENCOUNTER — Encounter: Payer: Self-pay | Admitting: Internal Medicine

## 2013-11-21 ENCOUNTER — Other Ambulatory Visit (INDEPENDENT_AMBULATORY_CARE_PROVIDER_SITE_OTHER): Payer: Managed Care, Other (non HMO)

## 2013-11-21 VITALS — BP 130/90 | HR 76 | Temp 97.8°F | Ht 67.0 in | Wt 170.5 lb

## 2013-11-21 DIAGNOSIS — Z0000 Encounter for general adult medical examination without abnormal findings: Secondary | ICD-10-CM

## 2013-11-21 DIAGNOSIS — F411 Generalized anxiety disorder: Secondary | ICD-10-CM

## 2013-11-21 DIAGNOSIS — J019 Acute sinusitis, unspecified: Secondary | ICD-10-CM

## 2013-11-21 DIAGNOSIS — Z23 Encounter for immunization: Secondary | ICD-10-CM

## 2013-11-21 LAB — CBC WITH DIFFERENTIAL/PLATELET
Basophils Absolute: 0 K/uL (ref 0.0–0.1)
Basophils Relative: 0.6 % (ref 0.0–3.0)
Eosinophils Absolute: 0.2 K/uL (ref 0.0–0.7)
Eosinophils Relative: 2.1 % (ref 0.0–5.0)
HCT: 47.3 % (ref 39.0–52.0)
Hemoglobin: 16 g/dL (ref 13.0–17.0)
Lymphocytes Relative: 35 % (ref 12.0–46.0)
Lymphs Abs: 2.6 K/uL (ref 0.7–4.0)
MCHC: 33.8 g/dL (ref 30.0–36.0)
MCV: 91.3 fl (ref 78.0–100.0)
Monocytes Absolute: 0.5 K/uL (ref 0.1–1.0)
Monocytes Relative: 7.3 % (ref 3.0–12.0)
Neutro Abs: 4.1 K/uL (ref 1.4–7.7)
Neutrophils Relative %: 55 % (ref 43.0–77.0)
Platelets: 243 K/uL (ref 150.0–400.0)
RBC: 5.18 Mil/uL (ref 4.22–5.81)
RDW: 13.4 % (ref 11.5–14.6)
WBC: 7.4 K/uL (ref 4.5–10.5)

## 2013-11-21 LAB — LIPID PANEL
Cholesterol: 247 mg/dL — ABNORMAL HIGH (ref 0–200)
HDL: 69.2 mg/dL
LDL Cholesterol: 161 mg/dL — ABNORMAL HIGH (ref 0–99)
Total CHOL/HDL Ratio: 4
Triglycerides: 86 mg/dL (ref 0.0–149.0)
VLDL: 17.2 mg/dL (ref 0.0–40.0)

## 2013-11-21 LAB — TSH: TSH: 2.39 u[IU]/mL (ref 0.35–5.50)

## 2013-11-21 LAB — PSA: PSA: 0.47 ng/mL (ref 0.10–4.00)

## 2013-11-21 LAB — BASIC METABOLIC PANEL
BUN: 15 mg/dL (ref 6–23)
CALCIUM: 9.6 mg/dL (ref 8.4–10.5)
CO2: 26 meq/L (ref 19–32)
Chloride: 103 mEq/L (ref 96–112)
Creatinine, Ser: 0.8 mg/dL (ref 0.4–1.5)
GFR: 104.39 mL/min (ref >60.00)
Glucose, Bld: 104 mg/dL — ABNORMAL HIGH (ref 70–99)
Potassium: 4.2 mEq/L (ref 3.5–5.1)
SODIUM: 137 meq/L (ref 135–145)

## 2013-11-21 LAB — HEPATIC FUNCTION PANEL
ALT: 15 U/L (ref 0–53)
AST: 17 U/L (ref 0–37)
Albumin: 4.5 g/dL (ref 3.5–5.2)
Alkaline Phosphatase: 67 U/L (ref 39–117)
Bilirubin, Direct: 0.1 mg/dL (ref 0.0–0.3)
Total Bilirubin: 0.8 mg/dL (ref 0.3–1.2)
Total Protein: 7.2 g/dL (ref 6.0–8.3)

## 2013-11-21 MED ORDER — ATORVASTATIN CALCIUM 10 MG PO TABS: 10.0000 mg | ORAL_TABLET | Freq: Every day | ORAL | Status: DC

## 2013-11-21 MED ORDER — CITALOPRAM HYDROBROMIDE 40 MG PO TABS: ORAL_TABLET | ORAL | Status: DC

## 2013-11-21 MED ORDER — ALPRAZOLAM 1 MG PO TABS: ORAL_TABLET | ORAL | Status: DC

## 2013-11-21 MED ORDER — AZITHROMYCIN 250 MG PO TABS: ORAL_TABLET | ORAL | Status: DC

## 2013-11-21 NOTE — Assessment & Plan Note (Signed)

## 2013-11-21 NOTE — Assessment & Plan Note (Signed)
stable overall by history and exam, recent data reviewed with pt, and pt to continue medical treatment as before,  to f/u any worsening symptoms or concerns  

## 2013-11-21 NOTE — Assessment & Plan Note (Signed)
Mild to mod, for antibx course,  to f/u any worsening symptoms or concerns 

## 2013-11-21 NOTE — Addendum Note (Signed)
Addended by: Scharlene GlossEWING, Camilla Skeen B on: 11/21/2013 09:06 AM   Modules accepted: Orders

## 2013-11-21 NOTE — Progress Notes (Signed)
Subjective:    Patient ID: Zachary Riley, male    DOB: 1966/01/24, 48 y.o.   MRN: [id number]  HPI  Here for wellness and f/u;  Overall doing ok;  Pt denies CP, worsening SOB, DOE, wheezing, orthopnea, PND, worsening LE edema, palpitations, dizziness or syncope.  Pt denies neurological change such as new headache, facial or extremity weakness.  Pt denies polydipsia, polyuria, or low sugar symptoms. Pt states overall good compliance with treatment and medications, good tolerability, and has been trying to follow lower cholesterol diet.  Pt denies worsening depressive symptoms, suicidal ideation or panic. No fever, night sweats, wt loss, loss of appetite, or other constitutional symptoms.  Pt states good ability with ADL's, has low fall risk, home safety reviewed and adequate, no other significant changes in hearing or vision, and only occasionally active with exercise. Working full time now, with today as first day of benefits, asks for med refills. Denies worsening depressive symptoms, suicidal ideation, or panic; has ongoing anxiety, not increased recently. Incidentally however -  Here with 2-3 days acute onset fever, facial pain, pressure, headache, general weakness and malaise, and greenish d/c, with mild ST and cough, but pt denies chest pain, wheezing, increased sob or doe. Past Medical History  Diagnosis Date  . ALLERGIC RHINITIS 02/14/2008  . ANXIETY 02/14/2008  . BACK PAIN 02/18/2009  . COMMON MIGRAINE 02/18/2009  . DEPRESSION 02/14/2008  . GERD 02/14/2008  . HEMATOCHEZIA 04/29/2010  . HYPERLIPIDEMIA 02/14/2008  . IBS 02/14/2008  . INGUINAL HERNIA 02/14/2008  . OTITIS MEDIA, ACUTE, LEFT 03/02/2010  . SINUSITIS- ACUTE-NOS 11/05/2009  . URI 08/27/2008   Past Surgical History  Procedure Laterality Date  . S/p rih 7/09      reports that he has quit smoking. He does not have any smokeless tobacco history on file. He reports that he drinks alcohol. He reports that he does not use illicit  drugs. family history includes Cancer in his father, mother, and other; Diabetes in his other; Hypertension in his other. Allergies  Allergen Reactions  . Esomeprazole Magnesium     REACTION: per patient  . Penicillins     REACTION: GI upset   No current outpatient prescriptions on file prior to visit.   No current facility-administered medications on file prior to visit.   Review of Systems Constitutional: Negative for diaphoresis, activity change, appetite change or unexpected weight change.  HENT: Negative for hearing loss, ear pain, facial swelling, mouth sores and neck stiffness.   Eyes: Negative for pain, redness and visual disturbance.  Respiratory: Negative for shortness of breath and wheezing.   Cardiovascular: Negative for chest pain and palpitations.  Gastrointestinal: Negative for diarrhea, blood in stool, abdominal distention or other pain Genitourinary: Negative for hematuria, flank pain or change in urine volume.  Musculoskeletal: Negative for myalgias and joint swelling.  Skin: Negative for color change and wound.  Neurological: Negative for syncope and numbness. other than noted Hematological: Negative for adenopathy.  Psychiatric/Behavioral: Negative for hallucinations, self-injury, decreased concentration and agitation.      Objective:   Physical Exam BP 130/90  Pulse 76  Temp(Src) 97.8 F (36.6 C) (Oral)  Ht 5\' 7"  (1.702 m)  Wt 170 lb 8 oz (77.338 kg)  BMI 26.70 kg/m2  SpO2 99% VS noted, mild ill Constitutional: Pt is oriented to person, place, and time. Appears well-developed and well-nourished.  Head: Normocephalic and atraumatic.  Right Ear: External ear normal.  Left Ear: External ear normal.  Nose: Nose normal.  Bilat tm's with mild erythema.  Max sinus areas mild tender.  Pharynx with mild erythema, no exudate Mouth/Throat: Oropharynx is clear and moist.  Eyes: Conjunctivae and EOM are normal. Pupils are equal, round, and reactive to light.   Neck: Normal range of motion. Neck supple. No JVD present. No tracheal deviation present.  Cardiovascular: Normal rate, regular rhythm, normal heart sounds and intact distal pulses.   Pulmonary/Chest: Effort normal and breath sounds normal.  Abdominal: Soft. Bowel sounds are normal. There is no tenderness. No HSM  Musculoskeletal: Normal range of motion. Exhibits no edema.  Lymphadenopathy:  Has no cervical adenopathy.  Neurological: Pt is alert and oriented to person, place, and time. Pt has normal reflexes. No cranial nerve deficit.  Skin: Skin is warm and dry. No rash noted.  Psychiatric:  Has mild anxious mood and affect. Behavior is normal.     Assessment & Plan:

## 2013-11-21 NOTE — Patient Instructions (Addendum)
You had the tetanus shot (Tdap) today  Please take all new medication as prescribed - the antibiotic Please continue all other medications as before, and refills have been done if requested. Please have the pharmacy call with any other refills you may need.  Please continue your efforts at being more active, low cholesterol diet, and weight control. You are otherwise up to date with prevention measures today.  Please go to the LAB in the Basement (turn left off the elevator) for the tests to be done today You will be contacted by phone if any changes need to be made immediately.  Otherwise, you will receive a letter about your results with an explanation, but please check with MyChart first.  Please return in 1 year for your yearly visit, or sooner if needed, with Lab testing done 3-5 days before

## 2013-11-21 NOTE — Progress Notes (Signed)
Pre visit review using our clinic review tool, if applicable. No additional management support is needed unless otherwise documented below in the visit note. 

## 2013-12-13 ENCOUNTER — Ambulatory Visit (INDEPENDENT_AMBULATORY_CARE_PROVIDER_SITE_OTHER): Payer: Managed Care, Other (non HMO) | Admitting: Internal Medicine

## 2013-12-13 ENCOUNTER — Encounter: Payer: Self-pay | Admitting: Internal Medicine

## 2013-12-13 VITALS — BP 128/90 | HR 83 | Temp 97.8°F | Wt 170.0 lb

## 2013-12-13 DIAGNOSIS — J029 Acute pharyngitis, unspecified: Secondary | ICD-10-CM

## 2013-12-13 MED ORDER — AZITHROMYCIN 250 MG PO TABS: ORAL_TABLET | ORAL | Status: DC

## 2013-12-13 MED ORDER — NAPROXEN 500 MG PO TABS: 500.0000 mg | ORAL_TABLET | Freq: Two times a day (BID) | ORAL | Status: DC

## 2013-12-13 NOTE — Patient Instructions (Signed)
Please take all new medication as prescribed - the antibiotic, and the pain medicine/antiinflammaytory  You can also take Delsym OTC for cough, and/or Mucinex (or it's generic off brand) for congestion, and tylenol as needed for pain.  Please continue all other medications as before, and refills have been done if requested. Please have the pharmacy call with any other refills you may need.  Please continue your efforts at being more active, low cholesterol diet, and weight control.  You are given the work note as well

## 2013-12-13 NOTE — Progress Notes (Signed)
Pre visit review using our clinic review tool, if applicable. No additional management support is needed unless otherwise documented below in the visit note. 

## 2013-12-13 NOTE — Progress Notes (Signed)
   Subjective:    Patient ID: Zachary Riley, male    DOB: 1966/08/13, 48 y.o.   MRN: [id number]  HPI   Here with 2-3 days acute onset fever, severe ST,, pressure, headache, general weakness and malaise, mild nonprod cough but pt denies chest pain, wheezing, increased sob or doe, orthopnea, PND, increased LE swelling, palpitations, dizziness or syncope.  Past Medical History  Diagnosis Date  . ALLERGIC RHINITIS 02/14/2008  . ANXIETY 02/14/2008  . BACK PAIN 02/18/2009  . COMMON MIGRAINE 02/18/2009  . DEPRESSION 02/14/2008  . GERD 02/14/2008  . HEMATOCHEZIA 04/29/2010  . HYPERLIPIDEMIA 02/14/2008  . IBS 02/14/2008  . INGUINAL HERNIA 02/14/2008  . OTITIS MEDIA, ACUTE, LEFT 03/02/2010  . SINUSITIS- ACUTE-NOS 11/05/2009  . URI 08/27/2008   Past Surgical History  Procedure Laterality Date  . S/p rih 7/09      reports that he has quit smoking. He does not have any smokeless tobacco history on file. He reports that he drinks alcohol. He reports that he does not use illicit drugs. family history includes Cancer in his father, mother, and other; Diabetes in his other; Hypertension in his other. Allergies  Allergen Reactions  . Esomeprazole Magnesium     REACTION: per patient  . Penicillins     REACTION: GI upset   Current Outpatient Prescriptions on File Prior to Visit  Medication Sig Dispense Refill  . ALPRAZolam (XANAX) 1 MG tablet TAKE ONE TABLET BY MOUTH 4 TIMES DAILY AS NEEDED  120 tablet  2  . atorvastatin (LIPITOR) 10 MG tablet Take 1 tablet (10 mg total) by mouth daily.  90 tablet  3  . citalopram (CELEXA) 40 MG tablet 1 and 1/2 by mouth once daily  135 tablet  3   No current facility-administered medications on file prior to visit.   Review of Systems All otherwise neg per pt     Objective:   Physical Exam .BP 128/90  Pulse 83  Temp(Src) 97.8 F (36.6 C) (Oral)  Wt 170 lb (77.111 kg)  SpO2 98% VS noted,  Constitutional: Pt appears well-developed, well-nourished.  HENT: Head:  NCAT.  Right Ear: External ear normal.  Left Ear: External ear normal.  Eyes: . Pupils are equal, round, and reactive to light. Conjunctivae and EOM are normal Bilat tm's with mild erythema.  Max sinus areas non tender.  Pharynx with severe erythema, no exudate Neck: Normal range of motion. Neck supple.  Cardiovascular: Normal rate and regular rhythm.   Pulmonary/Chest: Effort normal and breath sounds normal.  Neurological: Pt is alert. Not confused , motor grossly intact Skin: Skin is warm. No rash Psychiatric: Pt behavior is normal. No agitation.     Assessment & Plan:

## 2013-12-13 NOTE — Assessment & Plan Note (Signed)
Mild to mod, for antibx course,  to f/u any worsening symptoms or concerns 

## 2013-12-14 ENCOUNTER — Encounter: Payer: Self-pay | Admitting: Internal Medicine

## 2014-02-28 ENCOUNTER — Other Ambulatory Visit: Payer: Self-pay | Admitting: Internal Medicine

## 2014-02-28 NOTE — Telephone Encounter (Signed)
Done hardcopy to [facility]  

## 2014-03-01 NOTE — Telephone Encounter (Signed)
Faxed hardcopy to [facility] [place] 

## 2014-06-06 ENCOUNTER — Other Ambulatory Visit: Payer: Self-pay

## 2014-06-06 MED ORDER — ALPRAZOLAM 1 MG PO TABS: ORAL_TABLET | ORAL | Status: DC

## 2014-06-06 NOTE — Telephone Encounter (Signed)
Fax[facility] [place]

## 2014-06-06 NOTE — Telephone Encounter (Signed)
Done hardcopy to robin  Needs OV for further refills 

## 2014-06-07 ENCOUNTER — Other Ambulatory Visit: Payer: Self-pay

## 2014-06-07 ENCOUNTER — Encounter: Payer: Self-pay | Admitting: Internal Medicine

## 2014-06-07 ENCOUNTER — Ambulatory Visit (INDEPENDENT_AMBULATORY_CARE_PROVIDER_SITE_OTHER): Payer: Managed Care, Other (non HMO) | Admitting: Internal Medicine

## 2014-06-07 VITALS — BP 110/80 | HR 75 | Temp 98.0°F | Wt 177.0 lb

## 2014-06-07 DIAGNOSIS — B351 Tinea unguium: Secondary | ICD-10-CM

## 2014-06-07 DIAGNOSIS — E785 Hyperlipidemia, unspecified: Secondary | ICD-10-CM

## 2014-06-07 DIAGNOSIS — M545 Low back pain: Secondary | ICD-10-CM

## 2014-06-07 DIAGNOSIS — F32A Depression, unspecified: Secondary | ICD-10-CM

## 2014-06-07 DIAGNOSIS — Z0000 Encounter for general adult medical examination without abnormal findings: Secondary | ICD-10-CM

## 2014-06-07 DIAGNOSIS — Z0189 Encounter for other specified special examinations: Secondary | ICD-10-CM

## 2014-06-07 DIAGNOSIS — F329 Major depressive disorder, single episode, unspecified: Secondary | ICD-10-CM

## 2014-06-07 MED ORDER — TERBINAFINE HCL 250 MG PO TABS: 250.0000 mg | ORAL_TABLET | Freq: Every day | ORAL | Status: DC

## 2014-06-07 NOTE — Progress Notes (Signed)
Subjective:    Patient ID: Zachary Riley, male    DOB: Jan 08, 1966, 48 y.o.   MRN: [id number]  HPI  Here to f/u; overall doing ok,  Pt denies chest pain, increased sob or doe, wheezing, orthopnea, PND, increased LE swelling, palpitations, dizziness or syncope.  Pt denies polydipsia, polyuria, or low sugar symptoms such as weakness or confusion improved with po intake.  Pt denies new neurological symptoms such as new headache, or facial or extremity weakness or numbness.   Pt states overall good compliance with meds, has been trying to follow lower cholesterol diet, with wt overall stable> Has been taking statin but has ongoing flu like myalgias diffusely all over mild but persistent.  Denies worsening depressive symptoms, suicidal ideation, or panic.  Also with worsening onychomycotic changes worse to fingernail first finger left hand, asks for tx.   .Pt continues to have recurring LBP without change in severity, bowel or bladder change, fever, wt loss,  worsening LE pain/numbness/weakness, gait change or falls. Past Medical History  Diagnosis Date  . ALLERGIC RHINITIS 02/14/2008  . ANXIETY 02/14/2008  . BACK PAIN 02/18/2009  . COMMON MIGRAINE 02/18/2009  . DEPRESSION 02/14/2008  . GERD 02/14/2008  . HEMATOCHEZIA 04/29/2010  . HYPERLIPIDEMIA 02/14/2008  . IBS 02/14/2008  . INGUINAL HERNIA 02/14/2008  . OTITIS MEDIA, ACUTE, LEFT 03/02/2010  . SINUSITIS- ACUTE-NOS 11/05/2009  . URI 08/27/2008   Past Surgical History  Procedure Laterality Date  . S/p rih 7/09      reports that he has quit smoking. He does not have any smokeless tobacco history on file. He reports that he drinks alcohol. He reports that he does not use illicit drugs. family history includes Cancer in his father, mother, and other; Diabetes in his other; Hypertension in his other. Allergies  Allergen Reactions  . Esomeprazole Magnesium     REACTION: per patient  . Penicillins     REACTION: GI upset   Current Outpatient  Prescriptions on File Prior to Visit  Medication Sig Dispense Refill  . ALPRAZolam (XANAX) 1 MG tablet TAKE ONE TABLET BY MOUTH 4 TIMES DAILY AS NEEDED  120 tablet  2  . citalopram (CELEXA) 40 MG tablet 1 and 1/2 by mouth once daily  135 tablet  3  . naproxen (NAPROSYN) 500 MG tablet Take 1 tablet (500 mg total) by mouth 2 (two) times daily with a meal. As neede for pain  60 tablet  0  . azithromycin (ZITHROMAX Z-PAK) 250 MG tablet Use as directed  6 each  1   No current facility-administered medications on file prior to visit.   Review of Systems  Constitutional: Negative for unusual diaphoresis or other sweats  HENT: Negative for ringing in ear Eyes: Negative for double vision or worsening visual disturbance.  Respiratory: Negative for choking and stridor.   Gastrointestinal: Negative for vomiting or other signifcant bowel change Genitourinary: Negative for hematuria or decreased urine volume.  Musculoskeletal: Negative for other MSK pain or swelling Skin: Negative for color change and worsening wound.  Neurological: Negative for tremors and numbness other than noted  Psychiatric/Behavioral: Negative for decreased concentration or agitation other than above       Objective:   Physical Exam BP 110/80  Pulse 75  Temp(Src) 98 F (36.7 C)  Wt 177 lb (80.287 kg)  SpO2 98% VS noted,  Constitutional: Pt appears well-developed, well-nourished.  HENT: Head: NCAT.  Right Ear: External ear normal.  Left Ear: External ear normal.  Eyes: .  Pupils are equal, round, and reactive to light. Conjunctivae and EOM are normal Neck: Normal range of motion. Neck supple.  Cardiovascular: Normal rate and regular rhythm.   Pulmonary/Chest: Effort normal and breath sounds normal.  Neurological: Pt is alert. Not confused , motor grossly intact Skin: Skin is warm. No rash Spine nontender Psychiatric: Pt behavior is normal. No agitation. not depressed affect    Assessment & Plan:

## 2014-06-07 NOTE — Progress Notes (Signed)
Pre visit review using our clinic review tool, if applicable. No additional management support is needed unless otherwise documented below in the visit note. 

## 2014-06-07 NOTE — Patient Instructions (Signed)
Please take all new medication as prescribed - the lamisil  OK to stop the lipitor  Please continue all other medications as before  Please have the pharmacy call with any other refills you may need.  Please continue your efforts at being more active, low cholesterol diet, and weight control.  Please keep your appointments with your specialists as you may have planned  Please return in 6 months, or sooner if needed, with Lab testing done 3-5 days before

## 2014-06-08 NOTE — Assessment & Plan Note (Signed)
stable overall by history and exam, recent data reviewed with pt, and pt to hold on taking statin for now, work on lower chol diet, to f/u any worsening symptoms or concerns, re-check lipid next visit, consider replacement statin Lab Results  Component Value Date   LDLCALC 161* 11/21/2013

## 2014-06-08 NOTE — Assessment & Plan Note (Signed)
stable overall by history and exam, and pt to continue medical treatment as before,  to f/u any worsening symptoms or concerns 

## 2014-06-08 NOTE — Assessment & Plan Note (Signed)
stable overall by history and exam, recent data reviewed with pt, and pt to continue medical treatment as before,  to f/u any worsening symptoms or concerns Lab Results  Component Value Date   WBC 7.4 11/21/2013   HGB 16.0 11/21/2013   HCT 47.3 11/21/2013   PLT 243.0 11/21/2013   GLUCOSE 104* 11/21/2013   CHOL 247* 11/21/2013   TRIG 86.0 11/21/2013   HDL 69.20 11/21/2013   LDLCALC 161* 11/21/2013   ALT 15 11/21/2013   AST 17 11/21/2013   NA 137 11/21/2013   K 4.2 11/21/2013   CL 103 11/21/2013   CREATININE 0.8 11/21/2013   BUN 15 11/21/2013   CO2 26 11/21/2013   TSH 2.39 11/21/2013   PSA 0.47 11/21/2013

## 2014-06-08 NOTE — Assessment & Plan Note (Signed)
Ok for lamisil 205 qd x 6 wks, d/w pt risk of hepatic

## 2014-09-30 ENCOUNTER — Other Ambulatory Visit: Payer: Self-pay | Admitting: *Deleted

## 2014-10-01 MED ORDER — ALPRAZOLAM 1 MG PO TABS: ORAL_TABLET | ORAL | Status: DC

## 2014-10-01 NOTE — Telephone Encounter (Signed)
Left msg on triage requesting status on xanax refill...Raechel Chute/lmb

## 2014-10-01 NOTE — Telephone Encounter (Signed)
Done hardcopy to Zachary Riley  

## 2014-10-02 NOTE — Telephone Encounter (Signed)
Per cindy she  fax back to pharmacy...Raechel Chute/lmb

## 2014-11-06 ENCOUNTER — Ambulatory Visit (INDEPENDENT_AMBULATORY_CARE_PROVIDER_SITE_OTHER): Payer: Managed Care, Other (non HMO) | Admitting: Internal Medicine

## 2014-11-06 ENCOUNTER — Encounter: Payer: Self-pay | Admitting: Internal Medicine

## 2014-11-06 VITALS — BP 126/86 | HR 82 | Temp 97.6°F | Resp 20 | Ht 67.0 in | Wt 177.0 lb

## 2014-11-06 DIAGNOSIS — R05 Cough: Secondary | ICD-10-CM

## 2014-11-06 DIAGNOSIS — R059 Cough, unspecified: Secondary | ICD-10-CM

## 2014-11-06 DIAGNOSIS — R062 Wheezing: Secondary | ICD-10-CM

## 2014-11-06 MED ORDER — LEVOFLOXACIN 500 MG PO TABS: 500.0000 mg | ORAL_TABLET | Freq: Every day | ORAL | Status: DC

## 2014-11-06 MED ORDER — ALBUTEROL SULFATE HFA 108 (90 BASE) MCG/ACT IN AERS: 2.0000 | INHALATION_SPRAY | Freq: Four times a day (QID) | RESPIRATORY_TRACT | Status: DC | PRN

## 2014-11-06 MED ORDER — PREDNISONE 10 MG PO TABS: ORAL_TABLET | ORAL | Status: DC

## 2014-11-06 MED ORDER — HYDROCODONE-HOMATROPINE 5-1.5 MG/5ML PO SYRP: 5.0000 mL | ORAL_SOLUTION | Freq: Four times a day (QID) | ORAL | Status: DC | PRN

## 2014-11-06 NOTE — Patient Instructions (Signed)
Please take all new medication as prescribed - the antibiotic, cough medicine, and prednisone, as well as the inhaler if needed  Please continue all other medications as before, and refills have been done if requested.  Please have the pharmacy call with any other refills you may need.  Please keep your appointments with your specialists as you may have planned

## 2014-11-06 NOTE — Progress Notes (Signed)
   Subjective:    Patient ID: Zachary Riley, male    DOB: 1966-03-22, 49 y.o.   MRN: [id number]  HPI   Here with 2-3 days acute onset fever, facial pain, pressure, headache, general weakness and malaise, and greenish d/c, with mild ST and cough, but pt denies chest pain, wheezing, increased sob or doe, orthopnea, PND, increased LE swelling, palpitations, dizziness or syncope, except for onset mild wheezing/sob since last PM Past Medical History  Diagnosis Date  . ALLERGIC RHINITIS 02/14/2008  . ANXIETY 02/14/2008  . BACK PAIN 02/18/2009  . COMMON MIGRAINE 02/18/2009  . DEPRESSION 02/14/2008  . GERD 02/14/2008  . HEMATOCHEZIA 04/29/2010  . HYPERLIPIDEMIA 02/14/2008  . IBS 02/14/2008  . INGUINAL HERNIA 02/14/2008  . OTITIS MEDIA, ACUTE, LEFT 03/02/2010  . SINUSITIS- ACUTE-NOS 11/05/2009  . URI 08/27/2008   Past Surgical History  Procedure Laterality Date  . S/p rih 7/09      reports that he has quit smoking. He does not have any smokeless tobacco history on file. He reports that he drinks alcohol. He reports that he does not use illicit drugs. family history includes Cancer in his father, mother, and other; Diabetes in his other; Hypertension in his other. Allergies  Allergen Reactions  . Esomeprazole Magnesium     REACTION: per patient  . Penicillins     REACTION: GI upset   Current Outpatient Prescriptions on File Prior to Visit  Medication Sig Dispense Refill  . ALPRAZolam (XANAX) 1 MG tablet TAKE ONE TABLET BY MOUTH 4 TIMES DAILY AS NEEDED 120 tablet 2  . citalopram (CELEXA) 40 MG tablet 1 and 1/2 by mouth once daily 135 tablet 3  . naproxen (NAPROSYN) 500 MG tablet Take 1 tablet (500 mg total) by mouth 2 (two) times daily with a meal. As neede for pain 60 tablet 0  . terbinafine (LAMISIL) 250 MG tablet Take 1 tablet (250 mg total) by mouth daily. 45 tablet 0   No current facility-administered medications on file prior to visit.   Review of Systems  All otherwise neg per pt        Objective:   Physical Exam BP 126/86 mmHg  Pulse 82  Temp(Src) 97.6 F (36.4 C) (Oral)  Resp 20  Ht 5\' 7"  (1.702 m)  Wt 177 lb (80.287 kg)  BMI 27.72 kg/m2  SpO2 96% VS noted, mild ill Constitutional: Pt appears in no significant distress HENT: Head: NCAT.  Right Ear: External ear normal.  Left Ear: External ear normal.  Eyes: . Pupils are equal, round, and reactive to light. Conjunctivae and EOM are normal Bilat tm's with mild erythema.  Max sinus areas mild tender.  Pharynx with mild erythema, no exudate Neck: Normal range of motion. Neck supple.  Cardiovascular: Normal rate and regular rhythm.   Pulmonary/Chest: Effort normal and breath sounds decreased without rales but + mild diffuse wheezing.  Neurological: Pt is alert. Not confused , motor grossly intact Skin: Skin is warm. No rash, no LE edema Psychiatric: Pt behavior is normal. No agitation.     Assessment & Plan:

## 2014-11-06 NOTE — Progress Notes (Signed)
Pre visit review using our clinic review tool, if applicable. No additional management support is needed unless otherwise documented below in the visit note. 

## 2014-11-07 DIAGNOSIS — R062 Wheezing: Secondary | ICD-10-CM | POA: Insufficient documentation

## 2014-11-07 NOTE — Assessment & Plan Note (Addendum)
C/w mild bronchospasm, for predpac asd,  to f/u any worsening symptoms or concerns

## 2014-11-07 NOTE — Assessment & Plan Note (Signed)
Mild to mod, c/w sinusitis/cough, for antibx course,  to f/u any worsening symptoms or concerns

## 2014-12-13 ENCOUNTER — Encounter: Payer: Self-pay | Admitting: Internal Medicine

## 2014-12-13 ENCOUNTER — Other Ambulatory Visit (INDEPENDENT_AMBULATORY_CARE_PROVIDER_SITE_OTHER): Payer: Managed Care, Other (non HMO)

## 2014-12-13 ENCOUNTER — Ambulatory Visit (INDEPENDENT_AMBULATORY_CARE_PROVIDER_SITE_OTHER): Payer: Managed Care, Other (non HMO) | Admitting: Internal Medicine

## 2014-12-13 VITALS — BP 120/78 | HR 83 | Temp 97.6°F | Ht 67.0 in | Wt 175.2 lb

## 2014-12-13 DIAGNOSIS — Z0000 Encounter for general adult medical examination without abnormal findings: Secondary | ICD-10-CM | POA: Diagnosis not present

## 2014-12-13 LAB — LIPID PANEL
CHOL/HDL RATIO: 3
CHOLESTEROL: 210 mg/dL — AB (ref 0–200)
HDL: 60.1 mg/dL (ref >39.00)
LDL CALC: 128 mg/dL — AB (ref 0–99)
NonHDL: 149.9
Triglycerides: 108 mg/dL (ref 0.0–149.0)
VLDL: 21.6 mg/dL (ref 0.0–40.0)

## 2014-12-13 LAB — CBC WITH DIFFERENTIAL/PLATELET
BASOS ABS: 0 10*3/uL (ref 0.0–0.1)
Basophils Relative: 1.1 % (ref 0.0–3.0)
EOS ABS: 0.1 10*3/uL (ref 0.0–0.7)
Eosinophils Relative: 2.4 % (ref 0.0–5.0)
HCT: 46.4 % (ref 39.0–52.0)
Hemoglobin: 15.9 g/dL (ref 13.0–17.0)
LYMPHS ABS: 2.2 10*3/uL (ref 0.7–4.0)
Lymphocytes Relative: 50.6 % — ABNORMAL HIGH (ref 12.0–46.0)
MCHC: 34.3 g/dL (ref 30.0–36.0)
MCV: 89.3 fl (ref 78.0–100.0)
MONOS PCT: 14.7 % — AB (ref 3.0–12.0)
Monocytes Absolute: 0.6 10*3/uL (ref 0.1–1.0)
Neutro Abs: 1.3 10*3/uL — ABNORMAL LOW (ref 1.4–7.7)
Neutrophils Relative %: 31.2 % — ABNORMAL LOW (ref 43.0–77.0)
PLATELETS: 257 10*3/uL (ref 150.0–400.0)
RBC: 5.2 Mil/uL (ref 4.22–5.81)
RDW: 13.6 % (ref 11.5–15.5)
WBC: 4.3 10*3/uL (ref 4.0–10.5)

## 2014-12-13 LAB — HEPATIC FUNCTION PANEL
ALBUMIN: 4.6 g/dL (ref 3.5–5.2)
ALT: 14 U/L (ref 0–53)
AST: 15 U/L (ref 0–37)
Alkaline Phosphatase: 76 U/L (ref 39–117)
Bilirubin, Direct: 0.1 mg/dL (ref 0.0–0.3)
Total Bilirubin: 0.8 mg/dL (ref 0.2–1.2)
Total Protein: 7.2 g/dL (ref 6.0–8.3)

## 2014-12-13 LAB — URINALYSIS, ROUTINE W REFLEX MICROSCOPIC
BILIRUBIN URINE: NEGATIVE
Hgb urine dipstick: NEGATIVE
Ketones, ur: NEGATIVE
LEUKOCYTES UA: NEGATIVE
Nitrite: NEGATIVE
PH: 8 (ref 5.0–8.0)
RBC / HPF: NONE SEEN (ref 0–?)
Specific Gravity, Urine: 1.01 (ref 1.000–1.030)
Total Protein, Urine: NEGATIVE
Urine Glucose: NEGATIVE
Urobilinogen, UA: 0.2 (ref 0.0–1.0)

## 2014-12-13 LAB — BASIC METABOLIC PANEL
BUN: 15 mg/dL (ref 6–23)
CHLORIDE: 103 meq/L (ref 96–112)
CO2: 28 meq/L (ref 19–32)
CREATININE: 1.03 mg/dL (ref 0.40–1.50)
Calcium: 9.6 mg/dL (ref 8.4–10.5)
GFR: 82.12 mL/min (ref >60.00)
GLUCOSE: 118 mg/dL — AB (ref 70–99)
Potassium: 4.8 mEq/L (ref 3.5–5.1)
Sodium: 137 mEq/L (ref 135–145)

## 2014-12-13 LAB — PSA: PSA: 0.58 ng/mL (ref 0.10–4.00)

## 2014-12-13 LAB — TSH: TSH: 1.86 u[IU]/mL (ref 0.35–4.50)

## 2014-12-13 MED ORDER — NAPROXEN 500 MG PO TABS: 500.0000 mg | ORAL_TABLET | Freq: Two times a day (BID) | ORAL | Status: DC

## 2014-12-13 MED ORDER — ALPRAZOLAM 1 MG PO TABS: ORAL_TABLET | ORAL | Status: DC

## 2014-12-13 MED ORDER — CITALOPRAM HYDROBROMIDE 40 MG PO TABS: 60.0000 mg | ORAL_TABLET | Freq: Every day | ORAL | Status: DC

## 2014-12-13 NOTE — Assessment & Plan Note (Signed)

## 2014-12-13 NOTE — Progress Notes (Signed)
Pre visit review using our clinic review tool, if applicable. No additional management support is needed unless otherwise documented below in the visit note. 

## 2014-12-13 NOTE — Patient Instructions (Signed)
Please continue all other medications as before, and refills have been done if requested.  Please have the pharmacy call with any other refills you may need.  Please continue your efforts at being more active, low cholesterol diet, and weight control.  You are otherwise up to date with prevention measures today.  Please keep your appointments with your specialists as you may have planned  Please go to the LAB in the Basement (turn left off the elevator) for the tests to be done today  You will be contacted by phone if any changes need to be made immediately.  Otherwise, you will receive a letter about your results with an explanation, but please check with MyChart first  Please return in 1 year for your yearly visit, or sooner if needed, with Lab testing done 3-5 days before  

## 2014-12-13 NOTE — Progress Notes (Signed)
Subjective:    Patient ID: Zachary Riley, male    DOB: 1966-04-24, 49 y.o.   MRN: [id number]  HPI  Here for wellness and f/u;  Overall doing ok;  Pt denies Chest pain, worsening SOB, DOE, wheezing, orthopnea, PND, worsening LE edema, palpitations, dizziness or syncope.  Pt denies neurological change such as new headache, facial or extremity weakness.  Pt denies polydipsia, polyuria, or low sugar symptoms. Pt states overall good compliance with treatment and medications, good tolerability, and has been trying to follow appropriate diet.  Pt denies worsening depressive symptoms, suicidal ideation or panic. No fever, night sweats, wt loss, loss of appetite, or other constitutional symptoms.  Pt states good ability with ADL's, has low fall risk, home safety reviewed and adequate, no other significant changes in hearing or vision, and only occasionally active with exercise. No current complaints.  Did not tolerate statin after elev LDL 2015 due to myalgias, really trying to work on lower chol diet Past Medical History  Diagnosis Date  . ALLERGIC RHINITIS 02/14/2008  . ANXIETY 02/14/2008  . BACK PAIN 02/18/2009  . COMMON MIGRAINE 02/18/2009  . DEPRESSION 02/14/2008  . GERD 02/14/2008  . HEMATOCHEZIA 04/29/2010  . HYPERLIPIDEMIA 02/14/2008  . IBS 02/14/2008  . INGUINAL HERNIA 02/14/2008  . OTITIS MEDIA, ACUTE, LEFT 03/02/2010  . SINUSITIS- ACUTE-NOS 11/05/2009  . URI 08/27/2008   Past Surgical History  Procedure Laterality Date  . S/p rih 7/09      reports that he has quit smoking. He does not have any smokeless tobacco history on file. He reports that he drinks alcohol. He reports that he does not use illicit drugs. family history includes Cancer in his father, mother, and other; Diabetes in his other; Hypertension in his other. Allergies  Allergen Reactions  . Esomeprazole Magnesium     REACTION: per patient  . Penicillins     REACTION: GI upset   Current Outpatient Prescriptions on File Prior to  Visit  Medication Sig Dispense Refill  . albuterol (PROVENTIL HFA;VENTOLIN HFA) 108 (90 BASE) MCG/ACT inhaler Inhale 2 puffs into the lungs every 6 (six) hours as needed for wheezing or shortness of breath. 1 Inhaler 11  . ALPRAZolam (XANAX) 1 MG tablet TAKE ONE TABLET BY MOUTH 4 TIMES DAILY AS NEEDED 120 tablet 2  . citalopram (CELEXA) 40 MG tablet 1 and 1/2 by mouth once daily 135 tablet 3  . naproxen (NAPROSYN) 500 MG tablet Take 1 tablet (500 mg total) by mouth 2 (two) times daily with a meal. As neede for pain 60 tablet 0   No current facility-administered medications on file prior to visit.   Review of Systems Constitutional: Negative for increased diaphoresis, other activity, appetite or siginficant weight change other than noted HENT: Negative for worsening hearing loss, ear pain, facial swelling, mouth sores and neck stiffness.   Eyes: Negative for other worsening pain, redness or visual disturbance.  Respiratory: Negative for shortness of breath and wheezing  Cardiovascular: Negative for chest pain and palpitations.  Gastrointestinal: Negative for diarrhea, blood in stool, abdominal distention or other pain Genitourinary: Negative for hematuria, flank pain or change in urine volume.  Musculoskeletal: Negative for myalgias or other joint complaints.  Skin: Negative for color change and wound or drainage.  Neurological: Negative for syncope and numbness. other than noted Hematological: Negative for adenopathy. or other swelling Psychiatric/Behavioral: Negative for hallucinations, SI, self-injury, decreased concentration or other worsening agitation.      Objective:   Physical Exam BP  120/78 mmHg  Pulse 83  Temp(Src) 97.6 F (36.4 C) (Oral)  Ht 5\' 7"  (1.702 m)  Wt 175 lb 4 oz (79.493 kg)  BMI 27.44 kg/m2  SpO2 97% VS noted,  Constitutional: Pt is oriented to person, place, and time. Appears well-developed and well-nourished, in no significant distress Head: Normocephalic  and atraumatic.  Right Ear: External ear normal.  Left Ear: External ear normal.  Nose: Nose normal.  Mouth/Throat: Oropharynx is clear and moist.  Eyes: Conjunctivae and EOM are normal. Pupils are equal, round, and reactive to light.  Neck: Normal range of motion. Neck supple. No JVD present. No tracheal deviation present or significant neck LA or mass Cardiovascular: Normal rate, regular rhythm, normal heart sounds and intact distal pulses.   Pulmonary/Chest: Effort normal and breath sounds without rales or wheezing  Abdominal: Soft. Bowel sounds are normal. NT. No HSM  Musculoskeletal: Normal range of motion. Exhibits no edema.  Lymphadenopathy:  Has no cervical adenopathy.  Neurological: Pt is alert and oriented to person, place, and time. Pt has normal reflexes. No cranial nerve deficit. Motor grossly intact Skin: Skin is warm and dry. No rash noted.  Psychiatric:  Has normal mood and affect. Behavior is normal.      Assessment & Plan:

## 2014-12-13 NOTE — Addendum Note (Signed)
Addended by: Corwin LevinsJOHN, Shaine W on: 12/13/2014 09:28 AM   Modules accepted: Orders

## 2015-05-13 ENCOUNTER — Other Ambulatory Visit: Payer: Self-pay

## 2015-05-13 MED ORDER — ALPRAZOLAM 1 MG PO TABS: ORAL_TABLET | ORAL | Status: DC

## 2015-05-13 NOTE — Telephone Encounter (Signed)
Done hardcopy to Dahlia  

## 2015-05-14 NOTE — Telephone Encounter (Signed)
Rx faxed to pharmacy  

## 2015-06-26 ENCOUNTER — Ambulatory Visit (INDEPENDENT_AMBULATORY_CARE_PROVIDER_SITE_OTHER): Payer: Managed Care, Other (non HMO) | Admitting: Internal Medicine

## 2015-06-26 ENCOUNTER — Encounter: Payer: Self-pay | Admitting: Internal Medicine

## 2015-06-26 VITALS — BP 106/64 | HR 83 | Temp 97.8°F | Ht 67.0 in | Wt 178.0 lb

## 2015-06-26 DIAGNOSIS — R739 Hyperglycemia, unspecified: Secondary | ICD-10-CM | POA: Diagnosis not present

## 2015-06-26 DIAGNOSIS — M545 Low back pain, unspecified: Secondary | ICD-10-CM

## 2015-06-26 DIAGNOSIS — E785 Hyperlipidemia, unspecified: Secondary | ICD-10-CM | POA: Diagnosis not present

## 2015-06-26 MED ORDER — TIZANIDINE HCL 4 MG PO TABS: 4.0000 mg | ORAL_TABLET | Freq: Four times a day (QID) | ORAL | Status: DC | PRN

## 2015-06-26 MED ORDER — PREDNISONE 10 MG PO TABS: ORAL_TABLET | ORAL | Status: DC

## 2015-06-26 MED ORDER — HYDROCODONE-ACETAMINOPHEN 5-325 MG PO TABS: 1.0000 | ORAL_TABLET | Freq: Four times a day (QID) | ORAL | Status: DC | PRN

## 2015-06-26 MED ORDER — ALPRAZOLAM 1 MG PO TABS: ORAL_TABLET | ORAL | Status: DC

## 2015-06-26 MED ORDER — NAPROXEN 500 MG PO TABS: 500.0000 mg | ORAL_TABLET | Freq: Two times a day (BID) | ORAL | Status: DC

## 2015-06-26 NOTE — Assessment & Plan Note (Signed)
Etiology not clear, but per hx and exam most c/w lumbar djd/ddd flare, for pain control, muscle relaxer prn, predpac , work note, consider ortho if not improved 1 wk

## 2015-06-26 NOTE — Assessment & Plan Note (Signed)
stable overall by history and exam, recent data reviewed with pt, and pt to continue medical treatment as before,  to f/u any worsening symptoms or concerns Lab Results  Component Value Date   LDLCALC 128* 12/13/2014

## 2015-06-26 NOTE — Progress Notes (Signed)
Pre visit review using our clinic review tool, if applicable. No additional management support is needed unless otherwise documented below in the visit note. 

## 2015-06-26 NOTE — Patient Instructions (Signed)
Please take all new medication as prescribed - the pain medication, muscle relaxer, prednisone  Please continue all other medications as before, and refills have been done if requested  - the xanax  Please have the pharmacy call with any other refills you may need  Please keep your appointments with your specialists as you may have planned  You are given the work note  Please call in 1 wk if you feel you need orthopedic referral

## 2015-06-26 NOTE — Assessment & Plan Note (Signed)
Mild, asympt, has gained several lbs recently, to work on diet, f/u lab next visit

## 2015-06-26 NOTE — Progress Notes (Signed)
Subjective:    Patient ID: Zachary Riley, male    DOB: 09/30/1965, 49 y.o.   MRN: [id number]  HPI  Here with c/o 1 mo persistent LBP after acident at work, struck from behind while bending forward by a hard palate being moved by another worker, has seen UC 3 times - no fx but spine seemed "pushed to the right."  But most recently in 2-3 days much worse, now severe, constant, nothing seems to make better or worse, and to him seems MSK spasm like, a bit different pain than the trauma pain.  Tx with muscle relaxers, and in combo with xanax seems to help, especially at night. No fever, o bowel or bladder change, fever, wt loss,  worsening LE pain/numbness/weakness, gait change or falls.missed work last 2 days , tried to go in today but could not tolerate.  / Pt denies polydipsia, polyuria, or low sugar symptoms such as weakness or confusion improved with po intake.  Pt states overall good compliance with meds, trying to follow lower cholesterol, diabetic diet.  Denies worsening depressive symptoms, suicidal ideation, or panic; has ongoing anxiety, asks for xanax refill Past Medical History  Diagnosis Date  . ALLERGIC RHINITIS 02/14/2008  . ANXIETY 02/14/2008  . BACK PAIN 02/18/2009  . COMMON MIGRAINE 02/18/2009  . DEPRESSION 02/14/2008  . GERD 02/14/2008  . HEMATOCHEZIA 04/29/2010  . HYPERLIPIDEMIA 02/14/2008  . IBS 02/14/2008  . INGUINAL HERNIA 02/14/2008  . OTITIS MEDIA, ACUTE, LEFT 03/02/2010  . SINUSITIS- ACUTE-NOS 11/05/2009  . URI 08/27/2008   Past Surgical History  Procedure Laterality Date  . S/p rih 7/09      reports that he has quit smoking. He does not have any smokeless tobacco history on file. He reports that he drinks alcohol. He reports that he does not use illicit drugs. family history includes Cancer in his father, mother, and other; Diabetes in his other; Hypertension in his other. Allergies  Allergen Reactions  . Esomeprazole Magnesium     REACTION: per patient  . Penicillins    REACTION: GI upset   Current Outpatient Prescriptions on File Prior to Visit  Medication Sig Dispense Refill  . ALPRAZolam (XANAX) 1 MG tablet TAKE ONE TABLET BY MOUTH 4 TIMES DAILY AS NEEDED 120 tablet 2  . citalopram (CELEXA) 40 MG tablet Take 1.5 tablets (60 mg total) by mouth daily. 1 and 1/2 by mouth once daily 135 tablet 3  . albuterol (PROVENTIL HFA;VENTOLIN HFA) 108 (90 BASE) MCG/ACT inhaler Inhale 2 puffs into the lungs every 6 (six) hours as needed for wheezing or shortness of breath. (Patient not taking: Reported on 06/26/2015) 1 Inhaler 11   No current facility-administered medications on file prior to visit.   Review of Systems  Constitutional: Negative for unusual diaphoresis or night sweats HENT: Negative for ringing in ear or discharge Eyes: Negative for double vision or worsening visual disturbance.  Respiratory: Negative for choking and stridor.   Gastrointestinal: Negative for vomiting or other signifcant bowel change Genitourinary: Negative for hematuria or change in urine volume.  Musculoskeletal: Negative for other MSK pain or swelling Skin: Negative for color change and worsening wound.  Neurological: Negative for tremors and numbness other than noted  Psychiatric/Behavioral: Negative for decreased concentration or agitation other than above       Objective:   Physical Exam BP 106/64 mmHg  Pulse 83  Temp(Src) 97.8 F (36.6 C) (Oral)  Ht  (1.702 m)  Wt 178 lb (80.74 kg)  BMI  27.87 kg/m2  SpO2 97% VS noted,  Constitutional: Pt appears in no significant distress HENT: Head: NCAT.  Right Ear: External ear normal.  Left Ear: External ear normal.  Eyes: . Pupils are equal, round, and reactive to light. Conjunctivae and EOM are normal Neck: Normal range of motion. Neck supple.  Cardiovascular: Normal rate and regular rhythm.   Pulmonary/Chest: Effort normal and breath sounds without rales or wheezing.  Abd:  Soft, NT, ND, + BS Neurological: Pt is alert.  Not confused , motor grossly intact; spine nontender, and mild tender only to bilat lower lumbar paravertebral, no swelling or rash Skin: Skin is warm. No rash, no LE edema Psychiatric: Pt behavior is normal. No agitation.     Assessment & Plan:

## 2015-10-02 ENCOUNTER — Encounter: Payer: Self-pay | Admitting: Internal Medicine

## 2015-10-02 ENCOUNTER — Ambulatory Visit (INDEPENDENT_AMBULATORY_CARE_PROVIDER_SITE_OTHER): Payer: Managed Care, Other (non HMO) | Admitting: Internal Medicine

## 2015-10-02 VITALS — BP 128/82 | HR 77 | Temp 98.3°F | Resp 20 | Wt 181.0 lb

## 2015-10-02 DIAGNOSIS — R739 Hyperglycemia, unspecified: Secondary | ICD-10-CM

## 2015-10-02 DIAGNOSIS — F411 Generalized anxiety disorder: Secondary | ICD-10-CM

## 2015-10-02 DIAGNOSIS — J019 Acute sinusitis, unspecified: Secondary | ICD-10-CM

## 2015-10-02 DIAGNOSIS — J029 Acute pharyngitis, unspecified: Secondary | ICD-10-CM | POA: Insufficient documentation

## 2015-10-02 MED ORDER — LEVOFLOXACIN 500 MG PO TABS: 500.0000 mg | ORAL_TABLET | Freq: Every day | ORAL | Status: DC

## 2015-10-02 MED ORDER — ALPRAZOLAM 1 MG PO TABS: ORAL_TABLET | ORAL | Status: DC

## 2015-10-02 NOTE — Progress Notes (Signed)
Pre visit review using our clinic review tool, if applicable. No additional management support is needed unless otherwise documented below in the visit note. 

## 2015-10-02 NOTE — Patient Instructions (Signed)
Please take all new medication as prescribed - the antibiotic  Please continue all other medications as before, and refills have been done if requested - the xanax  Please have the pharmacy call with any other refills you may need.  Please continue your efforts at being more active, low cholesterol diet, and weight control.  Please keep your appointments with your specialists as you may have planned   

## 2015-10-02 NOTE — Progress Notes (Signed)
Subjective:    Patient ID: Zachary Riley, male    DOB: 03-Nov-1965, 50 y.o.   MRN: [id number]  HPI  Here with 2-3 days acute onset fever, facial pain, pressure, headache, general weakness and malaise, and greenish d/c, with mild ST and cough, but pt denies chest pain, wheezing, increased sob or doe, orthopnea, PND, increased LE swelling, palpitations, dizziness or syncope.  Denies worsening depressive symptoms, suicidal ideation, or panic; has ongoing anxiety, not increased recently, needs med refill.  Pt denies new neurological symptoms such as new headache, or facial or extremity weakness or numbness   Pt denies polydipsia, polyuria,  Past Medical History  Diagnosis Date  . ALLERGIC RHINITIS 02/14/2008  . ANXIETY 02/14/2008  . BACK PAIN 02/18/2009  . COMMON MIGRAINE 02/18/2009  . DEPRESSION 02/14/2008  . GERD 02/14/2008  . HEMATOCHEZIA 04/29/2010  . HYPERLIPIDEMIA 02/14/2008  . IBS 02/14/2008  . INGUINAL HERNIA 02/14/2008  . OTITIS MEDIA, ACUTE, LEFT 03/02/2010  . SINUSITIS- ACUTE-NOS 11/05/2009  . URI 08/27/2008   Past Surgical History  Procedure Laterality Date  . S/p rih 7/09      reports that he has quit smoking. He does not have any smokeless tobacco history on file. He reports that he drinks alcohol. He reports that he does not use illicit drugs. family history includes Cancer in his father, mother, and other; Diabetes in his other; Hypertension in his other. Allergies  Allergen Reactions  . Esomeprazole Magnesium     REACTION: per patient  . Penicillins     REACTION: GI upset   Current Outpatient Prescriptions on File Prior to Visit  Medication Sig Dispense Refill  . albuterol (PROVENTIL HFA;VENTOLIN HFA) 108 (90 BASE) MCG/ACT inhaler Inhale 2 puffs into the lungs every 6 (six) hours as needed for wheezing or shortness of breath. 1 Inhaler 11  . citalopram (CELEXA) 40 MG tablet Take 1.5 tablets (60 mg total) by mouth daily. 1 and 1/2 by mouth once daily 135 tablet 3  . diclofenac  (VOLTAREN) 75 MG EC tablet Take 75 mg by mouth.    Marland Kitchen HYDROcodone-acetaminophen (NORCO/VICODIN) 5-325 MG tablet Take 1 tablet by mouth every 6 (six) hours as needed for moderate pain. 40 tablet 0  . naproxen (NAPROSYN) 500 MG tablet Take 1 tablet (500 mg total) by mouth 2 (two) times daily with a meal. As neede for pain 180 tablet 1  . predniSONE (DELTASONE) 10 MG tablet 3 tabs by mouth per day for 3 days,2tabs per day for 3 days,1tab per day for 3 days 18 tablet 0  . tiZANidine (ZANAFLEX) 4 MG tablet Take 1 tablet (4 mg total) by mouth every 6 (six) hours as needed for muscle spasms. 40 tablet 1   No current facility-administered medications on file prior to visit.   Review of Systems  Constitutional: Negative for unusual diaphoresis or night sweats HENT: Negative for ringing in ear or discharge Eyes: Negative for double vision or worsening visual disturbance.  Respiratory: Negative for choking and stridor.   Gastrointestinal: Negative for vomiting or other signifcant bowel change Genitourinary: Negative for hematuria or change in urine volume.  Musculoskeletal: Negative for other MSK pain or swelling Skin: Negative for color change and worsening wound.  Neurological: Negative for tremors and numbness other than noted  Psychiatric/Behavioral: Negative for decreased concentration or agitation other than above       Objective:   Physical Exam BP 128/82 mmHg  Pulse 77  Temp(Src) 98.3 F (36.8 C) (Oral)  Resp 20  Wt 181 lb (82.101 kg)  SpO2 97% VS noted, mild ill Constitutional: Pt appears in no significant distress HENT: Head: NCAT.  Right Ear: External ear normal.  Left Ear: External ear normal.  Eyes: . Pupils are equal, round, and reactive to light. Conjunctivae and EOM are normal Bilat tm's with mild erythema.  Max sinus areas mild tender.  Pharynx with mild erythema, no exudate Neck: Normal range of motion. Neck supple.  Cardiovascular: Normal rate and regular rhythm.     Pulmonary/Chest: Effort normal and breath sounds without rales or wheezing.  Neurological: Pt is alert. Not confused , motor grossly intact Skin: Skin is warm. No rash, no LE edema Psychiatric: Pt behavior is normal. No agitation. 1+ nervous    Assessment & Plan:

## 2015-10-03 NOTE — Assessment & Plan Note (Signed)
stable overall by history and exam, recent data reviewed with pt, and pt to continue medical treatment as before,  to f/u any worsening symptoms or concerns Lab Results  Component Value Date   WBC 4.3 12/13/2014   HGB 15.9 12/13/2014   HCT 46.4 12/13/2014   PLT 257.0 12/13/2014   GLUCOSE 118* 12/13/2014   CHOL 210* 12/13/2014   TRIG 108.0 12/13/2014   HDL 60.10 12/13/2014   LDLCALC 128* 12/13/2014   ALT 14 12/13/2014   AST 15 12/13/2014   NA 137 12/13/2014   K 4.8 12/13/2014   CL 103 12/13/2014   CREATININE 1.03 12/13/2014   BUN 15 12/13/2014   CO2 28 12/13/2014   TSH 1.86 12/13/2014   PSA 0.58 12/13/2014   Pt to call for onset polys with illness

## 2015-10-03 NOTE — Assessment & Plan Note (Signed)
stable overall by history and exam, recent data reviewed with pt, and pt to continue medical treatment as before,  to f/u any worsening symptoms or concerns Lab Results  Component Value Date   WBC 4.3 12/13/2014   HGB 15.9 12/13/2014   HCT 46.4 12/13/2014   PLT 257.0 12/13/2014   GLUCOSE 118* 12/13/2014   CHOL 210* 12/13/2014   TRIG 108.0 12/13/2014   HDL 60.10 12/13/2014   LDLCALC 128* 12/13/2014   ALT 14 12/13/2014   AST 15 12/13/2014   NA 137 12/13/2014   K 4.8 12/13/2014   CL 103 12/13/2014   CREATININE 1.03 12/13/2014   BUN 15 12/13/2014   CO2 28 12/13/2014   TSH 1.86 12/13/2014   PSA 0.58 12/13/2014

## 2015-10-03 NOTE — Assessment & Plan Note (Signed)
Mild to mod, for antibx course,  to f/u any worsening symptoms or concerns 

## 2016-05-05 ENCOUNTER — Other Ambulatory Visit: Payer: Self-pay | Admitting: Internal Medicine

## 2016-05-06 NOTE — Telephone Encounter (Signed)
Done hardcopy to Corinne  Please ask pt to make rov as he is on high dose xanax for further refills

## 2016-06-01 ENCOUNTER — Ambulatory Visit (INDEPENDENT_AMBULATORY_CARE_PROVIDER_SITE_OTHER): Payer: Managed Care, Other (non HMO) | Admitting: Internal Medicine

## 2016-06-01 ENCOUNTER — Encounter: Payer: Self-pay | Admitting: Internal Medicine

## 2016-06-01 VITALS — BP 136/78 | HR 77 | Temp 98.9°F | Resp 20 | Wt 173.0 lb

## 2016-06-01 DIAGNOSIS — F411 Generalized anxiety disorder: Secondary | ICD-10-CM

## 2016-06-01 DIAGNOSIS — R739 Hyperglycemia, unspecified: Secondary | ICD-10-CM | POA: Diagnosis not present

## 2016-06-01 DIAGNOSIS — J029 Acute pharyngitis, unspecified: Secondary | ICD-10-CM | POA: Diagnosis not present

## 2016-06-01 MED ORDER — ALBUTEROL SULFATE HFA 108 (90 BASE) MCG/ACT IN AERS: 2.0000 | INHALATION_SPRAY | Freq: Four times a day (QID) | RESPIRATORY_TRACT | 11 refills | Status: DC | PRN

## 2016-06-01 MED ORDER — HYDROCODONE-ACETAMINOPHEN 5-325 MG PO TABS: 1.0000 | ORAL_TABLET | Freq: Four times a day (QID) | ORAL | 0 refills | Status: AC | PRN

## 2016-06-01 MED ORDER — TIZANIDINE HCL 4 MG PO TABS: 4.0000 mg | ORAL_TABLET | Freq: Four times a day (QID) | ORAL | 1 refills | Status: DC | PRN

## 2016-06-01 MED ORDER — ALPRAZOLAM 1 MG PO TABS: ORAL_TABLET | ORAL | 5 refills | Status: DC

## 2016-06-01 MED ORDER — NAPROXEN 500 MG PO TABS: 500.0000 mg | ORAL_TABLET | Freq: Two times a day (BID) | ORAL | 1 refills | Status: DC

## 2016-06-01 MED ORDER — AZITHROMYCIN 250 MG PO TABS: ORAL_TABLET | ORAL | 1 refills | Status: DC

## 2016-06-01 MED ORDER — CITALOPRAM HYDROBROMIDE 40 MG PO TABS: 60.0000 mg | ORAL_TABLET | Freq: Every day | ORAL | 3 refills | Status: DC

## 2016-06-01 NOTE — Progress Notes (Signed)
Subjective:    Patient ID: Zachary Riley, male    DOB: 1965-12-04, 50 y.o.   MRN: [id number]  HPI  Here with severe ST sudden onset x 2 days, felt ill but went to work anyway today, but has general fatigue, weakness, pain 7/10, sharp, constant, worse to swallow, better with warm liquids, nothing else makes better or worse, also with right > left submandibular tender swelling and knots.  Has felt warm but not sure about high fever or chills.  Has some occas mild dull HA, but no n/v, cough and Pt denies chest pain, increased sob or doe, wheezing, orthopnea, PND, increased LE swelling, palpitations, dizziness or syncope.  Pt denies new neurological symptoms such as new headache, or facial or extremity weakness or numbness   Pt denies polydipsia, polyuria. Denies worsening depressive symptoms, suicidal ideation, or panic; has ongoing anxiety, not increased recently, will try to wean xanax.  Past Medical History:  Diagnosis Date  . ALLERGIC RHINITIS 02/14/2008  . ANXIETY 02/14/2008  . BACK PAIN 02/18/2009  . COMMON MIGRAINE 02/18/2009  . DEPRESSION 02/14/2008  . GERD 02/14/2008  . HEMATOCHEZIA 04/29/2010  . HYPERLIPIDEMIA 02/14/2008  . IBS 02/14/2008  . INGUINAL HERNIA 02/14/2008  . OTITIS MEDIA, ACUTE, LEFT 03/02/2010  . SINUSITIS- ACUTE-NOS 11/05/2009  . URI 08/27/2008   Past Surgical History:  Pro[facility] Laterality Date  [facility]      reports that he has quit smoking. He does not have any smokeless tobacco history on file. He reports that he drinks alcohol. He reports that he does not use drugs. family history includes Cancer in his father, mother, and other; Diabetes in his other; Hypertension in his other. Allergies  Allergen Reactions  . Esomeprazole Magnesium     REACTION: per patient  . Penicillins     REACTION: GI upset   No current outpatient prescriptions on file prior to visit.   No current facility-administered medications on file prior to visit.    Review of Systems  Constitutional: Negative for unusual diaphoresis or night sweats HENT: Negative for ear swelling or discharge Eyes: Negative for worsening visual haziness  Respiratory: Negative for choking and stridor.   Gastrointestinal: Negative for distension or worsening eructation Genitourinary: Negative for retention or change in urine volume.  Musculoskeletal: Negative for other MSK pain or swelling Skin: Negative for color change and worsening wound Neurological: Negative for tremors and numbness other than noted  Psychiatric/Behavioral: Negative for decreased concentration or agitation other than above       Objective:   Physical Exam BP 136/78   Pulse 77   Temp 98.9 F (37.2 C) (Oral)   Resp 20   Wt 173 lb (78.5 kg)   SpO2 98%   BMI 27.10 kg/m  VS noted, mild ill Constitutional: Pt appears in no apparent distress HENT: Head: NCAT.  Right Ear: External ear normal.  Left Ear: External ear normal.  Bilat tm's with mild erythema.  Max sinus areas mod tender.  Pharynx with mild erythema, no exudate Eyes: . Pupils are equal, round, and reactive to light. Conjunctivae and EOM are normal Neck: Normal range of motion. Neck supple. but with right > left tender several LA Cardiovascular: Normal rate and regular rhythm.   Pulmonary/Chest: Effort normal and breath sounds without rales or wheezing.  Neurological: Pt is alert. Not confused , motor grossly intact Skin: Skin is warm. No rash, no LE edema Psychiatric: Pt behavior is normal. No agitation. 1+ nervous  Rapid strep - +  Assessment & Plan:

## 2016-06-01 NOTE — Patient Instructions (Addendum)
Please take all new medication as prescribed - the antibiotic  OK to decrease the xanax to three times per day as needed  Please continue all other medications as before, and refills have been done if requested.  Please have the pharmacy call with any other refills you may need.  Please keep your appointments with your specialists as you may have planned  You are given the work note

## 2016-06-01 NOTE — Progress Notes (Signed)
Pre visit review using our clinic review tool, if applicable. No additional management support is needed unless otherwise documented below in the visit note. 

## 2016-06-02 NOTE — Assessment & Plan Note (Signed)
Ok for xanax refill at tid dosing (decrease from qid),  to f/u any worsening symptoms or concerns

## 2016-06-02 NOTE — Assessment & Plan Note (Signed)
stable overall by history and exam, recent data reviewed with pt, and pt to continue medical treatment as before,  to f/u any worsening symptoms or concerns' Lab Results  Component Value Date   WBC 4.3 12/13/2014   HGB 15.9 12/13/2014   HCT 46.4 12/13/2014   PLT 257.0 12/13/2014   GLUCOSE 118 (H) 12/13/2014   CHOL 210 (H) 12/13/2014   TRIG 108.0 12/13/2014   HDL 60.10 12/13/2014   LDLCALC 128 (H) 12/13/2014   ALT 14 12/13/2014   AST 15 12/13/2014   NA 137 12/13/2014   K 4.8 12/13/2014   CL 103 12/13/2014   CREATININE 1.03 12/13/2014   BUN 15 12/13/2014   CO2 28 12/13/2014   TSH 1.86 12/13/2014   PSA 0.58 12/13/2014   Pt to call for onset polys or cbg > 200

## 2016-06-02 NOTE — Assessment & Plan Note (Signed)
mod, for antibx course,  to f/u any worsening symptoms or concerns 

## 2016-12-02 ENCOUNTER — Other Ambulatory Visit: Payer: Self-pay | Admitting: Internal Medicine

## 2016-12-02 NOTE — Telephone Encounter (Signed)
Done hardcopy to Shirron  

## 2016-12-02 NOTE — Telephone Encounter (Signed)
faxed

## 2016-12-17 ENCOUNTER — Ambulatory Visit (INDEPENDENT_AMBULATORY_CARE_PROVIDER_SITE_OTHER): Payer: Managed Care, Other (non HMO) | Admitting: Internal Medicine

## 2016-12-17 ENCOUNTER — Encounter: Payer: Self-pay | Admitting: Internal Medicine

## 2016-12-17 VITALS — BP 104/76 | HR 69 | Ht 67.0 in | Wt 178.0 lb

## 2016-12-17 DIAGNOSIS — F411 Generalized anxiety disorder: Secondary | ICD-10-CM | POA: Diagnosis not present

## 2016-12-17 DIAGNOSIS — R739 Hyperglycemia, unspecified: Secondary | ICD-10-CM | POA: Diagnosis not present

## 2016-12-17 DIAGNOSIS — M545 Low back pain, unspecified: Secondary | ICD-10-CM

## 2016-12-17 DIAGNOSIS — Z0001 Encounter for general adult medical examination with abnormal findings: Secondary | ICD-10-CM | POA: Diagnosis not present

## 2016-12-17 MED ORDER — TRAMADOL HCL 50 MG PO TABS: 50.0000 mg | ORAL_TABLET | Freq: Three times a day (TID) | ORAL | 0 refills | Status: DC | PRN

## 2016-12-17 MED ORDER — METHYLPREDNISOLONE ACETATE 80 MG/ML IJ SUSP
80.0000 mg | Freq: Once | INTRAMUSCULAR | Status: AC
  Administered 2016-12-17: 80 mg via INTRAMUSCULAR

## 2016-12-17 MED ORDER — PREDNISONE 10 MG PO TABS: ORAL_TABLET | ORAL | 0 refills | Status: DC

## 2016-12-17 MED ORDER — CYCLOBENZAPRINE HCL 5 MG PO TABS: 5.0000 mg | ORAL_TABLET | Freq: Three times a day (TID) | ORAL | 1 refills | Status: DC | PRN

## 2016-12-17 NOTE — Assessment & Plan Note (Signed)
Mild situationally worse, please continue same tx,  to f/u any worsening symptoms or concerns

## 2016-12-17 NOTE — Assessment & Plan Note (Signed)
stable overall by history and exam, recent data reviewed with pt, and pt to continue medical treatment as before,  to f/u any worsening symptoms or concerns No results found for: HGBA1C  Pt to call for onset polys or cbg > 200 with prednisone

## 2016-12-17 NOTE — Progress Notes (Signed)
Subjective:    Patient ID: Zachary Riley, male    DOB: 06/08/1966, 51 y.o.   MRN: [id number]  HPI  Here with acute onset lower back pain sharp, mild to start now seems mod to occas severe, starts bilat lower back and radiates to the neck, Had similar presentation sept 2016 after accident at work improved with steroid tx per pt.  Pt denies bowel or bladder change, fever, wt loss,  worsening LE pain/numbness/weakness, gait change or falls. Missed all of last wk work, and some hours this wk as well.  Pain worse to sitting, bending, twisting all of which he does at work.  Current naproxen not helping enough, and tizanidine too sedating so cannot take during daytime.  Denies urinary symptoms such as dysuria, frequency, urgency, flank pain, hematuria or n/v, fever, chills.  Denies worsening reflux, abd pain, dysphagia, n/v, bowel change or blood.  Last films done at [facility] in sept 2016 - not available for review at this time/not on emr  Denies worsening depressive symptoms, suicidal ideation, or panic; has ongoing anxiety, not increased recently.   Pt denies polydipsia, polyuria,  Past Medical History:  Diagnosis Date  . ALLERGIC RHINITIS 02/14/2008  . ANXIETY 02/14/2008  . BACK PAIN 02/18/2009  . COMMON MIGRAINE 02/18/2009  . DEPRESSION 02/14/2008  . GERD 02/14/2008  . HEMATOCHEZIA 04/29/2010  . HYPERLIPIDEMIA 02/14/2008  . IBS 02/14/2008  . INGUINAL HERNIA 02/14/2008  . OTITIS MEDIA, ACUTE, LEFT 03/02/2010  . SINUSITIS- ACUTE-NOS 11/05/2009  . URI 08/27/2008   Past Surgical History:  Procedure Laterality Date  . s/p [facility] 7/09      reports that he has quit smoking. He has never used smokeless tobacco. He reports that he drinks alcohol. He reports that he does not use drugs. family history includes Cancer in his father, mother, and other; Diabetes in his other; Hypertension in his other. Allergies  Allergen Reactions  . Esomeprazole Magnesium     REACTION: per patient  . Penicillins     REACTION: GI upset    Current Outpatient Prescriptions on File Prior to Visit  Medication Sig Dispense Refill  . albuterol (PROVENTIL HFA;VENTOLIN HFA) 108 (90 Base) MCG/ACT inhaler Inhale 2 puffs into the lungs every 6 (six) hours as needed for wheezing or shortness of breath. 1 Inhaler 11  . ALPRAZolam (XANAX) 1 MG tablet TAKE ONE TABLET BY MOUTH THREE TIMES DAILY AS NEEDED 90 tablet 5  . citalopram (CELEXA) 40 MG tablet Take 1.5 tablets (60 mg total) by mouth daily. 1 and 1/2 by mouth once daily 135 tablet 3  . HYDROcodone-acetaminophen (NORCO/VICODIN) 5-325 MG tablet Take 1 tablet by mouth every 6 (six) hours as needed for moderate pain. 40 tablet 0  . naproxen (NAPROSYN) 500 MG tablet Take 1 tablet (500 mg total) by mouth 2 (two) times daily with a meal. As neede for pain 180 tablet 1   No current facility-administered medications on file prior to visit.    Review of Systems  Constitutional: Negative for other unusual diaphoresis or sweats HENT: Negative for ear discharge or swelling Eyes: Negative for other worsening visual disturbances Respiratory: Negative for stridor or other swelling  Gastrointestinal: Negative for worsening distension or other blood Genitourinary: Negative for retention or other urinary change Musculoskeletal: Negative for other MSK pain or swelling Skin: Negative for color change or other new lesions Neurological: Negative for worsening tremors and other numbness  Psychiatric/Behavioral: Negative for worsening agitation or other fatigue All other system neg per pt  Objective:   Physical Exam BP 104/76   Pulse 69   Ht  (1.702 m)   Wt 178 lb (80.7 kg)   SpO2 99%   BMI 27.88 kg/m  VS noted, not ill appearing but in some pain Constitutional: Pt appears in NAD HENT: Head: NCAT.  Right Ear: External ear normal.  Left Ear: External ear normal.  Eyes: . Pupils are equal, round, and reactive to light. Conjunctivae and EOM are normal Nose: without d/c or deformity Neck:  Neck supple. Gross normal ROM Cardiovascular: Normal rate and regular rhythm.   Pulmonary/Chest: Effort normal and breath sounds without rales or wheezing.  Abd:  Soft, NT, ND, + BS, no organomegaly Spine: nontender midline; but has bilat lumbar diffuse muscular spasm/tender with lesser pain/tender to palpation at the thoracic and even cervical paravertebral areas Neurological: Pt is alert. At baseline orientation, motor grossly intact Skin: Skin is warm. No rashes, other new lesions, no LE edema Psychiatric: Pt behavior is normal without agitation  mild nervous, not depressed affect No other exam findings    Assessment & Plan:

## 2016-12-17 NOTE — Assessment & Plan Note (Signed)
Etiology seems at least superficially c/w rather marked bilat MSk strain/spasm, and cannot rule out underlying lumbar djd or ddd;  For now tx with tramadol, flexeril prn, and predpac asd; gave work note as well, to try to return apr 30.  Consider imaging if not improving or worsening

## 2016-12-17 NOTE — Progress Notes (Signed)
Pre visit review using our clinic review tool, if applicable. No additional management support is needed unless otherwise documented below in the visit note. 

## 2016-12-17 NOTE — Patient Instructions (Signed)
You had the steroid shot today  Please take all new medication as prescribed - the tramadol for pain, flexeril for muscle relaxer, and prednisone  Please continue all other medications as before, and refills have been done if requested.  Please have the pharmacy call with any other refills you may need.  Please keep your appointments with your specialists as you may have planned  Please return in 3 months, or sooner if needed, with Lab testing done 3-5 days before

## 2017-02-24 ENCOUNTER — Ambulatory Visit: Payer: Managed Care, Other (non HMO) | Admitting: Internal Medicine

## 2017-03-18 ENCOUNTER — Other Ambulatory Visit: Payer: Self-pay | Admitting: Orthopedic Surgery

## 2017-03-18 DIAGNOSIS — G8929 Other chronic pain: Secondary | ICD-10-CM

## 2017-03-18 DIAGNOSIS — M5441 Lumbago with sciatica, right side: Principal | ICD-10-CM

## 2017-03-29 ENCOUNTER — Ambulatory Visit
Admission: RE | Admit: 2017-03-29 | Discharge: 2017-03-29 | Disposition: A | Discharge status: Home / Self Care | Payer: Worker's Compensation | Source: Ambulatory Visit | Attending: Orthopedic Surgery | Admitting: Orthopedic Surgery

## 2017-03-29 DIAGNOSIS — G8929 Other chronic pain: Secondary | ICD-10-CM

## 2017-03-29 DIAGNOSIS — M5441 Lumbago with sciatica, right side: Principal | ICD-10-CM

## 2017-03-29 MED ORDER — METHYLPREDNISOLONE ACETATE 40 MG/ML INJ SUSP (RADIOLOG
120.0000 mg | Freq: Once | INTRAMUSCULAR | Status: AC
  Administered 2017-03-29: 120 mg via EPIDURAL

## 2017-03-29 MED ORDER — IOPAMIDOL (ISOVUE-M 200) INJECTION 41%
1.0000 mL | Freq: Once | INTRAMUSCULAR | Status: AC
  Administered 2017-03-29: 1 mL via EPIDURAL

## 2017-03-29 NOTE — Discharge Instructions (Signed)

## 2017-04-26 ENCOUNTER — Other Ambulatory Visit: Payer: Self-pay | Admitting: Orthopedic Surgery

## 2017-04-26 DIAGNOSIS — M5441 Lumbago with sciatica, right side: Principal | ICD-10-CM

## 2017-04-26 DIAGNOSIS — G8929 Other chronic pain: Secondary | ICD-10-CM

## 2017-05-05 ENCOUNTER — Other Ambulatory Visit: Payer: Self-pay

## 2017-05-06 ENCOUNTER — Ambulatory Visit
Admission: RE | Admit: 2017-05-06 | Discharge: 2017-05-06 | Disposition: A | Discharge status: Home / Self Care | Payer: 59 | Source: Ambulatory Visit | Attending: Orthopedic Surgery | Admitting: Orthopedic Surgery

## 2017-05-06 DIAGNOSIS — G8929 Other chronic pain: Secondary | ICD-10-CM

## 2017-05-06 DIAGNOSIS — M5441 Lumbago with sciatica, right side: Principal | ICD-10-CM

## 2017-05-06 MED ORDER — METHYLPREDNISOLONE ACETATE 40 MG/ML INJ SUSP (RADIOLOG
120.0000 mg | Freq: Once | INTRAMUSCULAR | Status: AC
  Administered 2017-05-06: 120 mg via EPIDURAL

## 2017-05-06 MED ORDER — IOPAMIDOL (ISOVUE-M 200) INJECTION 41%
1.0000 mL | Freq: Once | INTRAMUSCULAR | Status: AC
  Administered 2017-05-06: 1 mL via EPIDURAL

## 2017-05-26 ENCOUNTER — Ambulatory Visit (INDEPENDENT_AMBULATORY_CARE_PROVIDER_SITE_OTHER): Payer: 59 | Admitting: Internal Medicine

## 2017-05-26 ENCOUNTER — Encounter: Payer: Self-pay | Admitting: Internal Medicine

## 2017-05-26 VITALS — BP 118/86 | HR 87 | Temp 97.9°F | Ht 67.0 in | Wt 181.0 lb

## 2017-05-26 DIAGNOSIS — R739 Hyperglycemia, unspecified: Secondary | ICD-10-CM | POA: Diagnosis not present

## 2017-05-26 DIAGNOSIS — F411 Generalized anxiety disorder: Secondary | ICD-10-CM

## 2017-05-26 DIAGNOSIS — R6889 Other general symptoms and signs: Secondary | ICD-10-CM | POA: Diagnosis not present

## 2017-05-26 DIAGNOSIS — J069 Acute upper respiratory infection, unspecified: Secondary | ICD-10-CM

## 2017-05-26 LAB — POC INFLUENZA A&B (BINAX/QUICKVUE)
INFLUENZA A, POC: NEGATIVE
Influenza B, POC: NEGATIVE

## 2017-05-26 MED ORDER — LEVOFLOXACIN 500 MG PO TABS: 500.0000 mg | ORAL_TABLET | Freq: Every day | ORAL | 0 refills | Status: AC

## 2017-05-26 MED ORDER — ALPRAZOLAM 1 MG PO TABS: 1.0000 mg | ORAL_TABLET | Freq: Three times a day (TID) | ORAL | 5 refills | Status: DC | PRN

## 2017-05-26 NOTE — Assessment & Plan Note (Signed)
stable overall by history and exam, and pt to continue medical treatment as before,  to f/u any worsening symptoms or concerns 

## 2017-05-26 NOTE — Progress Notes (Signed)
Subjective:    Patient ID: Zachary Riley, male    DOB: Sep 20, 1965, 51 y.o.   MRN: [id number]  HPI   Here with 2-3 days acute onset fever, facial pain, pressure, headache, general weakness and malaise, and greenish d/c, with mild ST and cough, but pt denies chest pain, wheezing, increased sob or doe, orthopnea, PND, increased LE swelling, palpitations, dizziness or syncope.  Denies worsening depressive symptoms, suicidal ideation, or panic; has ongoing anxiety, not increased recently.   Pt denies new neurological symptoms such as new headache, or facial or extremity weakness or numbness   Pt denies polydipsia, polyuria Past Medical History:  Diagnosis Date  . ALLERGIC RHINITIS 02/14/2008  . ANXIETY 02/14/2008  . BACK PAIN 02/18/2009  . COMMON MIGRAINE 02/18/2009  . DEPRESSION 02/14/2008  . GERD 02/14/2008  . HEMATOCHEZIA 04/29/2010  . HYPERLIPIDEMIA 02/14/2008  . IBS 02/14/2008  . INGUINAL HERNIA 02/14/2008  . OTITIS MEDIA, ACUTE, LEFT 03/02/2010  . SINUSITIS- ACUTE-NOS 11/05/2009  . URI 08/27/2008   Past Surgical History:  Procedure Laterality Date  . s/p [facility] 7/09      reports that he has quit smoking. He has never used smokeless tobacco. He reports that he drinks alcohol. He reports that he does not use drugs. family history includes Cancer in his father, mother, and other; Diabetes in his other; Hypertension in his other. Allergies  Allergen Reactions  . Esomeprazole Magnesium     REACTION: per patient  . Penicillins     REACTION: GI upset   Current Outpatient Prescriptions on File Prior to Visit  Medication Sig Dispense Refill  . albuterol (PROVENTIL HFA;VENTOLIN HFA) 108 (90 Base) MCG/ACT inhaler Inhale 2 puffs into the lungs every 6 (six) hours as needed for wheezing or shortness of breath. 1 Inhaler 11  . citalopram (CELEXA) 40 MG tablet Take 1.5 tablets (60 mg total) by mouth daily. 1 and 1/2 by mouth once daily 135 tablet 3  . cyclobenzaprine (FLEXERIL) 5 MG tablet Take 1 tablet  (5 mg total) by mouth 3 (three) times daily as needed for muscle spasms. 30 tablet 1  . HYDROcodone-acetaminophen (NORCO/VICODIN) 5-325 MG tablet Take 1 tablet by mouth every 6 (six) hours as needed for moderate pain. 40 tablet 0  . naproxen (NAPROSYN) 500 MG tablet Take 1 tablet (500 mg total) by mouth 2 (two) times daily with a meal. As neede for pain 180 tablet 1  . predniSONE (DELTASONE) 10 MG tablet 3 tabs by mouth per day for 3 days,2tabs per day for 3 days1tab per day for 3 days 18 tablet 0  . traMADol (ULTRAM) 50 MG tablet Take 1 tablet (50 mg total) by mouth every 8 (eight) hours as needed. 40 tablet 0   No current facility-administered medications on file prior to visit.    Review of Systems  Constitutional: Negative for other unusual diaphoresis or sweats HENT: Negative for ear discharge or swelling Eyes: Negative for other worsening visual disturbances Respiratory: Negative for stridor or other swelling  Gastrointestinal: Negative for worsening distension or other blood Genitourinary: Negative for retention or other urinary change Musculoskeletal: Negative for other MSK pain or swelling Skin: Negative for color change or other new lesions Neurological: Negative for worsening tremors and other numbness  Psychiatric/Behavioral: Negative for worsening agitation or other fatigue All other system neg per pt    Objective:   Physical Exam BP 118/86   Pulse 87   Temp 97.9 F (36.6 C) (Oral)   Ht  (1.702  m)   Wt 181 lb (82.1 kg)   SpO2 100%   BMI 28.35 kg/m  VS noted, mild ill Constitutional: Pt appears in NAD HENT: Head: NCAT.  Right Ear: External ear normal.  Left Ear: External ear normal.  Eyes: . Pupils are equal, round, and reactive to light. Conjunctivae and EOM are normal Bilat tm's with mild erythema.  Max sinus areas mild tender.  Pharynx with mild erythema, no exudateNose: without d/c or deformity Neck: Neck supple. Gross normal ROM Cardiovascular: Normal  rate and regular rhythm.   Pulmonary/Chest: Effort normal and breath sounds without rales or wheezing.  Neurological: Pt is alert. At baseline orientation, motor grossly intact Skin: Skin is warm. No rashes, other new lesions, no LE edema Psychiatric: Pt behavior is normal without agitation, mild nervous, not depressed affect  No other exam findings  POC Influenza A&B(BINAX/QUICKVUE)  Order: [id number]  Status:  Final result Visible to patient:  No (Not Released) Dx:  Flu-like symptoms   Ref Range & Units 11:05  Influenza A, POC Negative Negative   Influenza B, POC Negative Negative            Assessment & Plan:

## 2017-05-26 NOTE — Assessment & Plan Note (Signed)
Mild to mod, for antibx course,  to f/u any worsening symptoms or concerns 

## 2017-05-26 NOTE — Assessment & Plan Note (Signed)
Stable, for med refill today 

## 2017-05-26 NOTE — Patient Instructions (Addendum)
Please take all new medication as prescribed - the antibiotic  You can also take Delsym OTC for cough, and/or Mucinex (or it's generic off brand) for congestion, and tylenol as needed for pain.  Please continue all other medications as before, and refills have been done if requested. - the xanax  Please have the pharmacy call with any other refills you may need.  Please keep your appointments with your specialists as you may have planned

## 2017-06-29 ENCOUNTER — Other Ambulatory Visit: Payer: Self-pay | Admitting: Orthopedic Surgery

## 2017-06-29 DIAGNOSIS — M4316 Spondylolisthesis, lumbar region: Secondary | ICD-10-CM

## 2017-07-13 ENCOUNTER — Ambulatory Visit
Admission: RE | Admit: 2017-07-13 | Discharge: 2017-07-13 | Disposition: A | Discharge status: Home / Self Care | Payer: 59 | Source: Ambulatory Visit | Attending: Orthopedic Surgery | Admitting: Orthopedic Surgery

## 2017-07-13 DIAGNOSIS — M4316 Spondylolisthesis, lumbar region: Secondary | ICD-10-CM

## 2017-07-13 MED ORDER — IOPAMIDOL (ISOVUE-M 200) INJECTION 41%
1.0000 mL | Freq: Once | INTRAMUSCULAR | Status: AC
  Administered 2017-07-13: 1 mL via EPIDURAL

## 2017-07-13 MED ORDER — METHYLPREDNISOLONE ACETATE 40 MG/ML INJ SUSP (RADIOLOG
120.0000 mg | Freq: Once | INTRAMUSCULAR | Status: AC
  Administered 2017-07-13: 120 mg via EPIDURAL

## 2017-10-04 ENCOUNTER — Other Ambulatory Visit: Payer: Self-pay | Admitting: Orthopedic Surgery

## 2017-10-04 DIAGNOSIS — G8929 Other chronic pain: Secondary | ICD-10-CM

## 2017-10-04 DIAGNOSIS — M5441 Lumbago with sciatica, right side: Principal | ICD-10-CM

## 2017-10-14 ENCOUNTER — Ambulatory Visit
Admission: RE | Admit: 2017-10-14 | Discharge: 2017-10-14 | Disposition: A | Discharge status: Home / Self Care | Payer: 59 | Source: Ambulatory Visit | Attending: Orthopedic Surgery | Admitting: Orthopedic Surgery

## 2017-10-14 DIAGNOSIS — M5441 Lumbago with sciatica, right side: Principal | ICD-10-CM

## 2017-10-14 DIAGNOSIS — G8929 Other chronic pain: Secondary | ICD-10-CM

## 2017-10-14 MED ORDER — METHYLPREDNISOLONE ACETATE 40 MG/ML INJ SUSP (RADIOLOG
120.0000 mg | Freq: Once | INTRAMUSCULAR | Status: AC
  Administered 2017-10-14: 120 mg via EPIDURAL

## 2017-10-14 MED ORDER — IOPAMIDOL (ISOVUE-M 200) INJECTION 41%
1.0000 mL | Freq: Once | INTRAMUSCULAR | Status: AC
  Administered 2017-10-14: 1 mL via EPIDURAL

## 2017-10-14 NOTE — Discharge Instructions (Signed)

## 2017-12-01 ENCOUNTER — Other Ambulatory Visit: Payer: Self-pay | Admitting: Internal Medicine

## 2017-12-01 NOTE — Telephone Encounter (Signed)
10/28/2017 90# 

## 2017-12-01 NOTE — Telephone Encounter (Signed)
Done erx 

## 2018-05-25 ENCOUNTER — Other Ambulatory Visit: Payer: Self-pay | Admitting: Orthopedic Surgery

## 2018-05-25 DIAGNOSIS — M5136 Other intervertebral disc degeneration, lumbar region: Secondary | ICD-10-CM

## 2018-05-26 ENCOUNTER — Other Ambulatory Visit: Payer: Self-pay

## 2018-05-26 ENCOUNTER — Other Ambulatory Visit: Payer: Self-pay | Admitting: Internal Medicine

## 2018-05-27 NOTE — Telephone Encounter (Signed)
Done erx  Please ask pt to make ROV for yearly visit

## 2018-06-07 ENCOUNTER — Ambulatory Visit
Admission: RE | Admit: 2018-06-07 | Discharge: 2018-06-07 | Disposition: A | Discharge status: Home / Self Care | Payer: 59 | Source: Ambulatory Visit | Attending: Orthopedic Surgery | Admitting: Orthopedic Surgery

## 2018-06-07 ENCOUNTER — Other Ambulatory Visit: Payer: Self-pay

## 2018-06-07 DIAGNOSIS — M545 Low back pain: Secondary | ICD-10-CM | POA: Diagnosis not present

## 2018-06-07 DIAGNOSIS — M5136 Other intervertebral disc degeneration, lumbar region: Secondary | ICD-10-CM | POA: Diagnosis not present

## 2018-06-07 MED ORDER — METHYLPREDNISOLONE ACETATE 40 MG/ML INJ SUSP (RADIOLOG
120.0000 mg | Freq: Once | INTRAMUSCULAR | Status: AC
  Administered 2018-06-07: 120 mg via EPIDURAL

## 2018-06-07 MED ORDER — IOPAMIDOL (ISOVUE-M 200) INJECTION 41%
1.0000 mL | Freq: Once | INTRAMUSCULAR | Status: AC
  Administered 2018-06-07: 1 mL via EPIDURAL

## 2018-06-22 ENCOUNTER — Other Ambulatory Visit: Payer: Self-pay | Admitting: Internal Medicine

## 2018-06-26 ENCOUNTER — Encounter: Payer: Self-pay | Admitting: Family

## 2018-06-26 ENCOUNTER — Ambulatory Visit: Payer: 59 | Admitting: Family

## 2018-06-26 ENCOUNTER — Ambulatory Visit: Payer: Self-pay | Admitting: *Deleted

## 2018-06-26 VITALS — BP 116/74 | HR 86 | Temp 97.9°F | Ht 67.0 in | Wt 182.0 lb

## 2018-06-26 DIAGNOSIS — J019 Acute sinusitis, unspecified: Secondary | ICD-10-CM | POA: Diagnosis not present

## 2018-06-26 MED ORDER — ALBUTEROL SULFATE HFA 108 (90 BASE) MCG/ACT IN AERS: 2.0000 | INHALATION_SPRAY | Freq: Four times a day (QID) | RESPIRATORY_TRACT | 1 refills | Status: DC | PRN

## 2018-06-26 MED ORDER — DOXYCYCLINE HYCLATE 100 MG PO TABS: 100.0000 mg | ORAL_TABLET | Freq: Two times a day (BID) | ORAL | 0 refills | Status: DC

## 2018-06-26 NOTE — Progress Notes (Signed)
Zachary Riley is a 52 y.o. male with the following history as recorded in [employer]:  Patient Active Problem List   Diagnosis Date Noted  . Acute upper respiratory infection 05/26/2017  . Lower back pain 06/26/2015  . Hyperglycemia 06/26/2015  . Onychomycosis 06/07/2014  . Carpal tunnel syndrome 07/03/2013  . Preventative health care 07/17/2012  . HEMATOCHEZIA 04/29/2010  . COMMON MIGRAINE 02/18/2009  . Backache 02/18/2009  . Hyperlipidemia 02/14/2008  . Anxiety state 02/14/2008  . Depression 02/14/2008  . ALLERGIC RHINITIS 02/14/2008  . GERD 02/14/2008  . INGUINAL HERNIA 02/14/2008  . IBS 02/14/2008    Current Outpatient Medications  Medication Sig Dispense Refill  . albuterol (PROVENTIL HFA;VENTOLIN HFA) 108 (90 Base) MCG/ACT inhaler Inhale 2 puffs into the lungs every 6 (six) hours as needed for wheezing or shortness of breath. 1 Inhaler 1  . ALPRAZolam (XANAX) 1 MG tablet Take 1 tablet (1 mg total) by mouth 3 (three) times daily as needed. 90 tablet 0  . cyclobenzaprine (FLEXERIL) 5 MG tablet Take 1 tablet (5 mg total) by mouth 3 (three) times daily as needed for muscle spasms. 30 tablet 1  . naproxen (NAPROSYN) 500 MG tablet Take 1 tablet (500 mg total) by mouth 2 (two) times daily with a meal. As neede for pain 180 tablet 1  . doxycycline (VIBRA-TABS) 100 MG tablet Take 1 tablet (100 mg total) by mouth 2 (two) times daily. 20 tablet 0   No current facility-administered medications for this visit.     Allergies: Esomeprazole magnesium and Penicillins  Past Medical History:  Diagnosis Date  . ALLERGIC RHINITIS 02/14/2008  . ANXIETY 02/14/2008  . BACK PAIN 02/18/2009  . COMMON MIGRAINE 02/18/2009  . DEPRESSION 02/14/2008  . GERD 02/14/2008  . HEMATOCHEZIA 04/29/2010  . HYPERLIPIDEMIA 02/14/2008  . IBS 02/14/2008  . INGUINAL HERNIA 02/14/2008  . OTITIS MEDIA, ACUTE, LEFT 03/02/2010  . SINUSITIS- ACUTE-NOS 11/05/2009  . URI 08/27/2008    Past Surgical History:  Procedure  Laterality Date  . s/p [facility] 7/09      Family History  Problem Relation Age of Onset  . Cancer Mother        colon  . Cancer Father        colon  . Cancer Other        lung  . Diabetes Other   . Hypertension Other     Social History   Tobacco Use  . Smoking status: Former Games developer  . Smokeless tobacco: Never Used  Substance Use Topics  . Alcohol use: Yes    Subjective:  Patient presents with cough/ congestion x 1 week; + productive cough; felt like his symptoms worsened in the past 24 hours; using Mucinex DM, Nasacort, cough drops; does hear himself wheezing occasionally- does not have current Rx for albuterol; + sinus pain, pressure;     Objective:  Vitals:   06/26/18 0838  BP: 116/74  Pulse: 86  Temp: 97.9 F (36.6 C)  TempSrc: Oral  SpO2: 96%  Weight: 182 lb 0.2 oz (82.6 kg)  Height: 5\' 7"  (1.702 m)    General: Well developed, well nourished, in no acute distress  Skin : Warm and dry.  Head: Normocephalic and atraumatic  Eyes: Sclera and conjunctiva clear; pupils round and reactive to light; extraocular movements intact  Ears: External normal; canals clear; tympanic membranes normal  Oropharynx: Pink, supple. No suspicious lesions  Neck: Supple without thyromegaly, adenopathy  Lungs: Respirations unlabored; clear to auscultation bilaterally without wheeze, rales,  rhonchi  CVS exam: normal rate and regular rhythm.  Neurologic: Alert and oriented; speech intact; face symmetrical; moves all extremities well; CNII-XII intact without focal deficit   Assessment:  1. Acute sinusitis, recurrence not specified, unspecified location     Plan:  Rx for Doxcycline 100 mg bid x 10 days; continue Nasacort AQ, Mucinex Dm; increase fluids, rest and follow-up worse, no better.  Patient will have to contact Dr. Jonny Ruiz about a Xanax refill- has not been seen here in over a year; last CPE done here in 2016;   Return in about 1 month (around 07/26/2018) for Dr. Jonny Ruiz.  No orders of  the defined types were placed in this encounter.   Requested Prescriptions   Signed Prescriptions Disp Refills  . albuterol (PROVENTIL HFA;VENTOLIN HFA) 108 (90 Base) MCG/ACT inhaler 1 Inhaler 1    Sig: Inhale 2 puffs into the lungs every 6 (six) hours as needed for wheezing or shortness of breath.  . doxycycline (VIBRA-TABS) 100 MG tablet 20 tablet 0    Sig: Take 1 tablet (100 mg total) by mouth 2 (two) times daily.

## 2018-06-26 NOTE — Telephone Encounter (Signed)
He called in c/o having a very runny nose and "feeling terrible like maybe I have the flu".   He then mentioned to the agent he was short of breath.   He was talking in complete sentences and not in distress while talking with him.    "I've had bronchitis before and I thought maybe it was that too".     He informed me he is on the way to the office now and is 10 minutes away.  The agent scheduled him with Ria Clock, FNP for 8:40 this morning.   So no further triage done since he was not in distress and was 10 minutes from the office for his appt.    Reason for Disposition . [1] Continuous (nonstop) coughing AND [2] keeps from working or sleeping  Answer Assessment - Initial Assessment Questions 1. RESPIRATORY STATUS: "Describe your breathing?" (e.g., wheezing, shortness of breath, unable to speak, severe coughing)      I think I have bronchitis.   I think I have the flu.   I've been exposed. 2. ONSET: "When did this breathing problem begin?"      Last week I noticed I was coughing and sneezing.   Today I feel awful.   3. PATTERN "Does the difficult breathing come and go, or has it been constant since it started?"       4. SEVERITY: "How bad is your breathing?" (e.g., mild, moderate, severe)    - MILD: No SOB at rest, mild SOB with walking, speaks normally in sentences, can lay down, no retractions, pulse < 100.    - MODERATE: SOB at rest, SOB with minimal exertion and prefers to sit, cannot lie down flat, speaks in phrases, mild retractions, audible wheezing, pulse 100-120.    - SEVERE: Very SOB at rest, speaks in single words, struggling to breathe, sitting hunched forward, retractions, pulse > 120      *No Answer* 5. RECURRENT SYMPTOM: "Have you had difficulty breathing before?" If so, ask: "When was the last time?" and "What happened that time?"      *No Answer* 6. CARDIAC HISTORY: "Do you have any history of heart disease?" (e.g., heart attack, angina, bypass surgery, angioplasty)    *No Answer* 7. LUNG HISTORY: "Do you have any history of lung disease?"  (e.g., pulmonary embolus, asthma, emphysema)     *No Answer* 8. CAUSE: "What do you think is causing the breathing problem?"      *No Answer* 9. OTHER SYMPTOMS: "Do you have any other symptoms? (e.g., dizziness, runny nose, cough, chest pain, fever)     *No Answer* 10. PREGNANCY: "Is there any chance you are pregnant?" "When was your last menstrual period?"       *No Answer* 11. TRAVEL: "Have you traveled out of the country in the last month?" (e.g., travel history, exposures)       *No Answer*  Protocols used: BREATHING DIFFICULTY-A-AH

## 2018-06-27 ENCOUNTER — Telehealth: Payer: Self-pay

## 2018-06-27 NOTE — Telephone Encounter (Signed)
Copied from CRM [id number]. Topic: General - Inquiry >> Jun 27, 2018  7:44 AM Windy Kalata, NT wrote: Reason for CRM: patient is called and states he was seen in office yesterday 06/26/18 and was written out of work for that day. He was told if he did not feel better today then she would write him out of work today 06/27/18 as well. Patient would like this note sent on MyChart.

## 2018-07-04 ENCOUNTER — Other Ambulatory Visit: Payer: Self-pay | Admitting: Internal Medicine

## 2018-07-04 NOTE — Telephone Encounter (Signed)
   LOV:06/26/18 Ria ClockLaura Murray NextOV:08/01/18 Last Filled/Quantity:05/28/18 90#

## 2018-07-04 NOTE — Telephone Encounter (Signed)
Done erx 

## 2018-08-01 ENCOUNTER — Other Ambulatory Visit (INDEPENDENT_AMBULATORY_CARE_PROVIDER_SITE_OTHER): Payer: 59

## 2018-08-01 ENCOUNTER — Encounter: Payer: Self-pay | Admitting: Internal Medicine

## 2018-08-01 ENCOUNTER — Ambulatory Visit: Payer: 59 | Admitting: Internal Medicine

## 2018-08-01 VITALS — BP 116/78 | HR 81 | Temp 98.1°F | Ht 67.0 in | Wt 178.0 lb

## 2018-08-01 DIAGNOSIS — Z114 Encounter for screening for human immunodeficiency virus [HIV]: Secondary | ICD-10-CM | POA: Diagnosis not present

## 2018-08-01 DIAGNOSIS — R739 Hyperglycemia, unspecified: Secondary | ICD-10-CM

## 2018-08-01 DIAGNOSIS — J069 Acute upper respiratory infection, unspecified: Secondary | ICD-10-CM | POA: Diagnosis not present

## 2018-08-01 DIAGNOSIS — Z0001 Encounter for general adult medical examination with abnormal findings: Secondary | ICD-10-CM

## 2018-08-01 DIAGNOSIS — Z0000 Encounter for general adult medical examination without abnormal findings: Secondary | ICD-10-CM

## 2018-08-01 LAB — URINALYSIS, ROUTINE W REFLEX MICROSCOPIC
Bilirubin Urine: NEGATIVE
Hgb urine dipstick: NEGATIVE
Leukocytes, UA: NEGATIVE
Nitrite: NEGATIVE
RBC / HPF: NONE SEEN (ref 0–?)
SPECIFIC GRAVITY, URINE: 1.015 (ref 1.000–1.030)
Total Protein, Urine: NEGATIVE
Urine Glucose: NEGATIVE
Urobilinogen, UA: 0.2 (ref 0.0–1.0)
WBC, UA: NONE SEEN (ref 0–?)
pH: 7 (ref 5.0–8.0)

## 2018-08-01 LAB — CBC WITH DIFFERENTIAL/PLATELET
Basophils Absolute: 0.1 10*3/uL (ref 0.0–0.1)
Basophils Relative: 1.3 % (ref 0.0–3.0)
Eosinophils Absolute: 0.2 10*3/uL (ref 0.0–0.7)
Eosinophils Relative: 2.5 % (ref 0.0–5.0)
HCT: 46 % (ref 39.0–52.0)
Hemoglobin: 15.8 g/dL (ref 13.0–17.0)
Lymphocytes Relative: 28 % (ref 12.0–46.0)
Lymphs Abs: 2 10*3/uL (ref 0.7–4.0)
MCHC: 34.4 g/dL (ref 30.0–36.0)
MCV: 88.5 fl (ref 78.0–100.0)
MONOS PCT: 8.6 % (ref 3.0–12.0)
Monocytes Absolute: 0.6 10*3/uL (ref 0.1–1.0)
Neutro Abs: 4.3 10*3/uL (ref 1.4–7.7)
Neutrophils Relative %: 59.6 % (ref 43.0–77.0)
Platelets: 211 10*3/uL (ref 150.0–400.0)
RBC: 5.2 Mil/uL (ref 4.22–5.81)
RDW: 13.5 % (ref 11.5–15.5)
WBC: 7.2 10*3/uL (ref 4.0–10.5)

## 2018-08-01 LAB — HEPATIC FUNCTION PANEL
ALBUMIN: 4.6 g/dL (ref 3.5–5.2)
ALT: 28 U/L (ref 0–53)
AST: 21 U/L (ref 0–37)
Alkaline Phosphatase: 72 U/L (ref 39–117)
BILIRUBIN DIRECT: 0.1 mg/dL (ref 0.0–0.3)
TOTAL PROTEIN: 7 g/dL (ref 6.0–8.3)
Total Bilirubin: 0.8 mg/dL (ref 0.2–1.2)

## 2018-08-01 LAB — PSA: PSA: 0.59 ng/mL (ref 0.10–4.00)

## 2018-08-01 LAB — BASIC METABOLIC PANEL
BUN: 19 mg/dL (ref 6–23)
CO2: 29 mEq/L (ref 19–32)
CREATININE: 0.79 mg/dL (ref 0.40–1.50)
Calcium: 9.5 mg/dL (ref 8.4–10.5)
Chloride: 104 mEq/L (ref 96–112)
GFR: 109.88 mL/min (ref >60.00)
Glucose, Bld: 105 mg/dL — ABNORMAL HIGH (ref 70–99)
Potassium: 4.5 mEq/L (ref 3.5–5.1)
Sodium: 140 mEq/L (ref 135–145)

## 2018-08-01 LAB — LIPID PANEL
Cholesterol: 221 mg/dL — ABNORMAL HIGH (ref 0–200)
HDL: 58.8 mg/dL (ref >39.00)
LDL Cholesterol: 141 mg/dL — ABNORMAL HIGH (ref 0–99)
NonHDL: 161.76
TRIGLYCERIDES: 106 mg/dL (ref 0.0–149.0)
Total CHOL/HDL Ratio: 4
VLDL: 21.2 mg/dL (ref 0.0–40.0)

## 2018-08-01 LAB — HEMOGLOBIN A1C: HEMOGLOBIN A1C: 5.5 % (ref 4.6–6.5)

## 2018-08-01 LAB — TSH: TSH: 1.37 u[IU]/mL (ref 0.35–4.50)

## 2018-08-01 MED ORDER — ALBUTEROL SULFATE HFA 108 (90 BASE) MCG/ACT IN AERS: 2.0000 | INHALATION_SPRAY | Freq: Four times a day (QID) | RESPIRATORY_TRACT | 1 refills | Status: DC | PRN

## 2018-08-01 MED ORDER — ALPRAZOLAM 1 MG PO TABS: ORAL_TABLET | ORAL | 2 refills | Status: DC

## 2018-08-01 MED ORDER — LEVOFLOXACIN 500 MG PO TABS: 500.0000 mg | ORAL_TABLET | Freq: Every day | ORAL | 0 refills | Status: AC

## 2018-08-01 MED ORDER — NAPROXEN 500 MG PO TABS: 500.0000 mg | ORAL_TABLET | Freq: Two times a day (BID) | ORAL | 1 refills | Status: DC

## 2018-08-01 MED ORDER — CYCLOBENZAPRINE HCL 5 MG PO TABS: 5.0000 mg | ORAL_TABLET | Freq: Three times a day (TID) | ORAL | 1 refills | Status: DC | PRN

## 2018-08-01 NOTE — Assessment & Plan Note (Addendum)
Mild to mod, for antibx course,  to f/u any worsening symptoms or concerns  In addition to the time spent performing CPE, I spent an additional 15 minutes face to face,in which greater than 50% of this time was spent in counseling and coordination of care for patient's illness as documented, including the differential dx, treatment, further evaluation and other management of acute upper resp infection, hyperglycemia

## 2018-08-01 NOTE — Assessment & Plan Note (Signed)

## 2018-08-01 NOTE — Progress Notes (Signed)
Subjective:    Patient ID: Zachary Riley, male    DOB: 03/05/1966, 52 y.o.   MRN: [id number]  HPI     Here for wellness and f/u;  Overall doing ok;  Pt denies Chest pain, worsening SOB, DOE, wheezing, orthopnea, PND, worsening LE edema, palpitations, dizziness or syncope.  Pt denies neurological change such as new headache, facial or extremity weakness.  Pt denies polydipsia, polyuria, or low sugar symptoms. Pt states overall good compliance with treatment and medications, good tolerability, and has been trying to follow appropriate diet.  Pt denies worsening depressive symptoms, suicidal ideation or panic. No fever, night sweats, wt loss, loss of appetite, or other constitutional symptoms.  Pt states good ability with ADL's, has low fall risk, home safety reviewed and adequate, no other significant changes in hearing or vision, and only occasionally active with exercise.    c/o ? Food poisoning last wk, and today with  Here with 2-3 days acute onset fever, facial pain, pressure, headache, general weakness and malaise, and greenish d/c, with mild ST and cough and nausea.  Has been seen 4-5 times at UC in the past yr with recurring sinus infections, several after traveling on planes.  Declines CT sinus now.  Also taking mucinex and nasacort to try to help but keeps coming back Past Medical History:  Diagnosis Date  . ALLERGIC RHINITIS 02/14/2008  . ANXIETY 02/14/2008  . BACK PAIN 02/18/2009  . COMMON MIGRAINE 02/18/2009  . DEPRESSION 02/14/2008  . GERD 02/14/2008  . HEMATOCHEZIA 04/29/2010  . HYPERLIPIDEMIA 02/14/2008  . IBS 02/14/2008  . INGUINAL HERNIA 02/14/2008  . OTITIS MEDIA, ACUTE, LEFT 03/02/2010  . SINUSITIS- ACUTE-NOS 11/05/2009  . URI 08/27/2008   Past Surgical History:  Procedure Laterality Date  . s/p [facility] 7/09      reports that he has quit smoking. He has never used smokeless tobacco. He reports that he drinks alcohol. He reports that he does not use drugs. family history includes  Cancer in his father, mother, and other; Diabetes in his other; Hypertension in his other. Allergies  Allergen Reactions  . Esomeprazole Magnesium     REACTION: per patient  . Penicillins     REACTION: GI upset   No current outpatient medications on file prior to visit.   No current facility-administered medications on file prior to visit.    ,Review of Systems Constitutional: Negative for other unusual diaphoresis, sweats, appetite or weight changes HENT: Negative for other worsening hearing loss, ear pain, facial swelling, mouth sores or neck stiffness.   Eyes: Negative for other worsening pain, redness or other visual disturbance.  Respiratory: Negative for other stridor or swelling Cardiovascular: Negative for other palpitations or other chest pain  Gastrointestinal: Negative for worsening diarrhea or loose stools, blood in stool, distention or other pain Genitourinary: Negative for hematuria, flank pain or other change in urine volume.  Musculoskeletal: Negative for myalgias or other joint swelling.  Skin: Negative for other color change, or other wound or worsening drainage.  Neurological: Negative for other syncope or numbness. Hematological: Negative for other adenopathy or swelling Psychiatric/Behavioral: Negative for hallucinations, other worsening agitation, SI, self-injury, or new decreased concentration All other system neg per pt    Objective:   Physical Exam BP 116/78   Pulse 81   Temp 98.1 F (36.7 C) (Oral)   Ht 5\' 7"  (1.702 m)   Wt 178 lb (80.7 kg)   SpO2 95%   BMI 27.88 kg/m  VS noted, mild  ill Constitutional: Pt is oriented to person, place, and time. Appears well-developed and well-nourished, in no significant distress and comfortable Head: Normocephalic and atraumatic  Eyes: Conjunctivae and EOM are normal. Pupils are equal, round, and reactive to light Bilat tm's with mild erythema.  Max sinus areas mild tender.  Pharynx with mild erythema, no  exudate Right Ear: External ear normal without discharge Left Ear: External ear normal without discharge Nose: Nose without discharge or deformity Mouth/Throat: Oropharynx is without other ulcerations and moist  Neck: Normal range of motion. Neck supple. No JVD present. No tracheal deviation present or significant neck LA or mass Cardiovascular: Normal rate, regular rhythm, normal heart sounds and intact distal pulses.   Pulmonary/Chest: WOB normal and breath sounds without rales or wheezing  Abdominal: Soft. Bowel sounds are normal. NT. No HSM  Musculoskeletal: Normal range of motion. Exhibits no edema Lymphadenopathy: Has no other cervical adenopathy.  Neurological: Pt is alert and oriented to person, place, and time. Pt has normal reflexes. No cranial nerve deficit. Motor grossly intact, Gait intact Skin: Skin is warm and dry. No rash noted or new ulcerations Psychiatric:  Has normal mood and affect. Behavior is normal without agitation No other exam findings Lab Results  Component Value Date   WBC 7.2 08/01/2018   HGB 15.8 08/01/2018   HCT 46.0 08/01/2018   PLT 211.0 08/01/2018   GLUCOSE 105 (H) 08/01/2018   CHOL 221 (H) 08/01/2018   TRIG 106.0 08/01/2018   HDL 58.80 08/01/2018   LDLCALC 141 (H) 08/01/2018   ALT 28 08/01/2018   AST 21 08/01/2018   NA 140 08/01/2018   K 4.5 08/01/2018   CL 104 08/01/2018   CREATININE 0.79 08/01/2018   BUN 19 08/01/2018   CO2 29 08/01/2018   TSH 1.37 08/01/2018   PSA 0.59 08/01/2018   HGBA1C 5.5 08/01/2018          Assessment & Plan:

## 2018-08-01 NOTE — Assessment & Plan Note (Signed)
stable overall by history and exam, recent data reviewed with pt, and pt to continue medical treatment as before,  to f/u any worsening symptoms or concerns  

## 2018-08-01 NOTE — Patient Instructions (Signed)
Please take all new medication as prescribed - the antibiotic  Please continue all other medications as before, and refills have been done if requested.  Please have the pharmacy call with any other refills you may need.  Please continue your efforts at being more active, low cholesterol diet, and weight control.  You are otherwise up to date with prevention measures today.  Please keep your appointments with your specialists as you may have planned  Please go to the LAB in the Basement (turn left off the elevator) for the tests to be done today  You will be contacted by phone if any changes need to be made immediately.  Otherwise, you will receive a letter about your results with an explanation, but please check with MyChart first.  Please remember to sign up for MyChart if you have not done so, as this will be important to you in the future with finding out test results, communicating by private email, and scheduling acute appointments online when needed.  Please return in 1 year for your yearly visit, or sooner if needed, with Lab testing done 3-5 days before  

## 2018-08-02 LAB — HIV ANTIBODY (ROUTINE TESTING W REFLEX): HIV 1&2 Ab, 4th Generation: NONREACTIVE

## 2018-08-25 ENCOUNTER — Encounter: Payer: Self-pay | Admitting: Internal Medicine

## 2018-08-25 ENCOUNTER — Ambulatory Visit: Payer: 59 | Admitting: Internal Medicine

## 2018-08-25 VITALS — BP 116/84 | HR 69 | Temp 97.8°F | Ht 67.0 in | Wt 176.0 lb

## 2018-08-25 DIAGNOSIS — R062 Wheezing: Secondary | ICD-10-CM | POA: Diagnosis not present

## 2018-08-25 DIAGNOSIS — R6889 Other general symptoms and signs: Secondary | ICD-10-CM

## 2018-08-25 DIAGNOSIS — R739 Hyperglycemia, unspecified: Secondary | ICD-10-CM

## 2018-08-25 DIAGNOSIS — J069 Acute upper respiratory infection, unspecified: Secondary | ICD-10-CM | POA: Diagnosis not present

## 2018-08-25 LAB — POCT INFLUENZA A/B
Influenza A, POC: NEGATIVE
Influenza B, POC: NEGATIVE

## 2018-08-25 MED ORDER — METHYLPREDNISOLONE ACETATE 80 MG/ML IJ SUSP
80.0000 mg | Freq: Once | INTRAMUSCULAR | Status: AC
  Administered 2018-08-25: 80 mg via INTRAMUSCULAR

## 2018-08-25 MED ORDER — HYDROCODONE-HOMATROPINE 5-1.5 MG/5ML PO SYRP: 5.0000 mL | ORAL_SOLUTION | Freq: Four times a day (QID) | ORAL | 0 refills | Status: AC | PRN

## 2018-08-25 MED ORDER — PREDNISONE 10 MG PO TABS: ORAL_TABLET | ORAL | 0 refills | Status: DC

## 2018-08-25 MED ORDER — AZITHROMYCIN 250 MG PO TABS: ORAL_TABLET | ORAL | 1 refills | Status: DC

## 2018-08-25 NOTE — Assessment & Plan Note (Signed)
Mild to mod, cant r/o bacterial, for antibx course,  to f/u any worsening symptoms or concerns

## 2018-08-25 NOTE — Patient Instructions (Addendum)
Your flu test was: negative  You had the steroid shot today  Please take all new medication as prescribed - the antibiotic, cough medicine and prednisone  Please continue all other medications as before, including the inhaler  Please have the pharmacy call with any other refills you may need.  Please keep your appointments with your specialists as you may have planned

## 2018-08-25 NOTE — Assessment & Plan Note (Signed)
Mild, for predpac asd, inhaler prn, cough med prn, declines cxr,  to f/u any worsening symptoms or concerns

## 2018-08-25 NOTE — Assessment & Plan Note (Signed)
stable overall by history and exam, recent data reviewed with pt, and pt to continue medical treatment as before,  to f/u any worsening symptoms or concerns  

## 2018-08-25 NOTE — Progress Notes (Signed)
Subjective:    Patient ID: Zachary Riley, male    DOB: 1966/01/05, 53 y.o.   MRN: [id number]  HPI  Here with just ovr 10 days onset URI symtpoms with cough, now with mild worsening wheeziness, cough, and sob despite his inhaler use increased.   Also has felt feverish, facial pain, pressure, headache, general weakness and malaise, just going on too long.  Worked this AM, but is asking off for today and tomorrow due to illness.  Pt denies new neurological symptoms such as new headache, or facial or extremity weakness or numbness   Pt denies polydipsia, polyuria.  Has had obvious exposure over the holidays to both a grandchild with URI symptoms, and girlfriend dx with influenza dec 26.   Past Medical History:  Diagnosis Date  . ALLERGIC RHINITIS 02/14/2008  . ANXIETY 02/14/2008  . BACK PAIN 02/18/2009  . COMMON MIGRAINE 02/18/2009  . DEPRESSION 02/14/2008  . GERD 02/14/2008  . HEMATOCHEZIA 04/29/2010  . HYPERLIPIDEMIA 02/14/2008  . IBS 02/14/2008  . INGUINAL HERNIA 02/14/2008  . OTITIS MEDIA, ACUTE, LEFT 03/02/2010  . SINUSITIS- ACUTE-NOS 11/05/2009  . URI 08/27/2008   Past Surgical History:  Procedure Laterality Date  . s/p [facility] 7/09      reports that he has quit smoking. He has never used smokeless tobacco. He reports current alcohol use. He reports that he does not use drugs. family history includes Cancer in his father, mother, and another family member; Diabetes in an other family member; Hypertension in an other family member. Allergies  Allergen Reactions  . Esomeprazole Magnesium     REACTION: per patient  . Penicillins     REACTION: GI upset   Current Outpatient Medications on File Prior to Visit  Medication Sig Dispense Refill  . albuterol (PROVENTIL HFA;VENTOLIN HFA) 108 (90 Base) MCG/ACT inhaler Inhale 2 puffs into the lungs every 6 (six) hours as needed for wheezing or shortness of breath. 1 Inhaler 1  . ALPRAZolam (XANAX) 1 MG tablet TAKE 1 TABLET BY MOUTH THREE TIMES DAILY AS  NEEDED 90 tablet 2  . cyclobenzaprine (FLEXERIL) 5 MG tablet Take 1 tablet (5 mg total) by mouth 3 (three) times daily as needed for muscle spasms. 30 tablet 1  . naproxen (NAPROSYN) 500 MG tablet Take 1 tablet (500 mg total) by mouth 2 (two) times daily with a meal. As neede for pain 180 tablet 1   No current facility-administered medications on file prior to visit.    Review of Systems  Constitutional: Negative for other unusual diaphoresis or sweats HENT: Negative for ear discharge or swelling Eyes: Negative for other worsening visual disturbances Respiratory: Negative for stridor or other swelling  Gastrointestinal: Negative for worsening distension or other blood Genitourinary: Negative for retention or other urinary change Musculoskeletal: Negative for other MSK pain or swelling Skin: Negative for color change or other new lesions Neurological: Negative for worsening tremors and other numbness  Psychiatric/Behavioral: Negative for worsening agitation or other fatigue All other system neg per pt    Objective:   Physical Exam BP 116/84   Pulse 69   Temp 97.8 F (36.6 C) (Oral)   Ht 5\' 7"  (1.702 m)   Wt 176 lb (79.8 kg)   SpO2 97%   BMI 27.57 kg/m  VS noted, mild ill Constitutional: Pt appears in NAD HENT: Head: NCAT.  Right Ear: External ear normal.  Left Ear: External ear normal.  Eyes: . Pupils are equal, round, and reactive to light. Conjunctivae and  EOM are normal Bilat tm's with mild erythema.  Max sinus areas mild tender.  Pharynx with mild erythema, no exudate  Nose: without d/c or deformity Neck: Neck supple. Gross normal ROM Cardiovascular: Normal rate and regular rhythm.   Pulmonary/Chest: Effort normal and breath sounds without rales or wheezing.  Neurological: Pt is alert. At baseline orientation, motor grossly intact Skin: Skin is warm. No rashes, other new lesions, no LE edema Psychiatric: Pt behavior is normal without agitation  No other exam  findings  Rapid flu testing POCT - eg Lab Results  Component Value Date   WBC 7.2 08/01/2018   HGB 15.8 08/01/2018   HCT 46.0 08/01/2018   PLT 211.0 08/01/2018   GLUCOSE 105 (H) 08/01/2018   CHOL 221 (H) 08/01/2018   TRIG 106.0 08/01/2018   HDL 58.80 08/01/2018   LDLCALC 141 (H) 08/01/2018   ALT 28 08/01/2018   AST 21 08/01/2018   NA 140 08/01/2018   K 4.5 08/01/2018   CL 104 08/01/2018   CREATININE 0.79 08/01/2018   BUN 19 08/01/2018   CO2 29 08/01/2018   TSH 1.37 08/01/2018   PSA 0.59 08/01/2018   HGBA1C 5.5 08/01/2018       Assessment & Plan:

## 2018-08-28 ENCOUNTER — Encounter: Payer: Self-pay | Admitting: Internal Medicine

## 2018-10-28 ENCOUNTER — Other Ambulatory Visit: Payer: Self-pay | Admitting: Internal Medicine

## 2018-10-30 NOTE — Telephone Encounter (Signed)
Done erx 

## 2018-12-30 ENCOUNTER — Other Ambulatory Visit: Payer: Self-pay | Admitting: Internal Medicine

## 2019-01-01 NOTE — Telephone Encounter (Signed)
[place] Controlled Database Checked Last filled: 11/29/18 # 90 LOV w/you: 08/01/18 Next appt w/you: none

## 2019-01-01 NOTE — Telephone Encounter (Signed)
Done erx 

## 2019-02-27 ENCOUNTER — Telehealth: Payer: Self-pay

## 2019-02-27 ENCOUNTER — Ambulatory Visit: Payer: Self-pay | Admitting: Internal Medicine

## 2019-02-27 DIAGNOSIS — Z20822 Contact with and (suspected) exposure to covid-19: Secondary | ICD-10-CM

## 2019-02-27 NOTE — Telephone Encounter (Addendum)
Patient called in to schedule his covid testing. Appointment scheduled for tomorrow, 02/28/19 at [place] at [facility], advised of location and to wear a mask for everyone in the vehicle, he verbalized understanding. Order placed.   Juliet Rude, [place]    02/27/19 9:59 AM Note   Please contact pt to schedule covid testing due to exposure. Thanks!

## 2019-02-27 NOTE — Telephone Encounter (Signed)
Please contact pt to schedule covid testing due to exposure. Thanks!

## 2019-02-27 NOTE — Telephone Encounter (Signed)
  Pt. Reports he has had COVID 19 exposure at work with 2 co-workers that tested positive. Has some sinus congestion today . Would like to be tested. Please advise pt. Answer Assessment - Initial Assessment Questions 1. CLOSE CONTACT: "Who is the person with the confirmed or suspected COVID-19 infection that you were exposed to?"     2 co-workers 2. PLACE of CONTACT: "Where were you when you were exposed to COVID-19?" (e.g., home, school, medical waiting room; which city?)     Work 3. TYPE of CONTACT: "How much contact was there?" (e.g., sitting next to, live in same house, work in same office, same building)     Same building 4. DURATION of CONTACT: "How long were you in contact with the COVID-19 patient?" (e.g., a few seconds, passed by person, a few minutes, live with the patient)     Close contact 5. DATE of CONTACT: "When did you have contact with a COVID-19 patient?" (e.g., how many days ago)     Yesterday 6. TRAVEL: "Have you traveled out of the country recently?" If so, "When and where?"     * Also ask about out-of-state travel, since the CDC has identified some high-risk cities for community spread in the [place].     * Note: Travel becomes less relevant if there is widespread community transmission where the patient lives.     No 7. COMMUNITY SPREAD: "Are there lots of cases of COVID-19 (community spread) where you live?" (See public health department website, if unsure)       Yes 8. SYMPTOMS: "Do you have any symptoms?" (e.g., fever, cough, breathing difficulty)     Sinus symptoms 9. PREGNANCY OR POSTPARTUM: "Is there any chance you are pregnant?" "When was your last menstrual period?" "Did you deliver in the last 2 weeks?"     n/a 10. HIGH RISK: "Do you have any heart or lung problems? Do you have a weak immune system?" (e.g., CHF, COPD, asthma, HIV positive, chemotherapy, renal failure, diabetes mellitus, sickle cell anemia)       No  Protocols used: CORONAVIRUS (COVID-19)  EXPOSURE-A-AH

## 2019-02-27 NOTE — Telephone Encounter (Signed)
Scheduled for tomorrow.

## 2019-02-28 ENCOUNTER — Other Ambulatory Visit: Payer: Self-pay

## 2019-02-28 DIAGNOSIS — Z20822 Contact with and (suspected) exposure to covid-19: Secondary | ICD-10-CM

## 2019-03-06 LAB — NOVEL CORONAVIRUS, NAA: SARS-CoV-2, NAA: NOT DETECTED

## 2019-03-07 ENCOUNTER — Encounter: Payer: Self-pay | Admitting: Internal Medicine

## 2019-03-07 ENCOUNTER — Ambulatory Visit: Payer: Self-pay | Admitting: *Deleted

## 2019-03-07 NOTE — Telephone Encounter (Signed)
[place] for doxy

## 2019-03-07 NOTE — Telephone Encounter (Signed)
Pt called with complaints of fatigue (his greatets concern), chest tightness, sneezing; his last contact with COVID exposure 02/26/2019, and tested on 02/28/2019 (negative result); his inhaler helped with chest tightness; other symptoms: low grade temp 99; intermittent headache for past 9 days rated 5 out of 10; he also has a productive cough with clear sputum with dark spots, clear nasal secretions, diarrhea; there were 2 days last week when his sense of taste was altered; recommendations made per nurse triage protocol; he verbalized understanding; he sees Dr Cathlean Cower, [facility]; the pt can be contacted at [phone]; will route to office for notification.   Reason for Disposition . [1] MODERATE weakness (i.e., interferes with work, school, normal activities) AND [2] persists > 3 days  Answer Assessment - Initial Assessment Questions 1. DESCRIPTION: "Describe how you are feeling."     Tired, "don't feel good" 2. SEVERITY: "How bad is it?"  "Can you stand and walk?"   - MILD - Feels weak or tired, but does not interfere with work, school or normal activities   - [place]ot to stand and walk; weakness interferes with work, school, or normal activities   - SEVERE - Unable to stand or walk    moderate 3. ONSET:  "When did the weakness begin?"   02/28/2019 4. CAUSE: "What do you think is causing the weakness?"    Not sure 5. MEDICINES: "Have you recently started a new medicine or had a change in the amount of a medicine?"  no 6. OTHER SYMPTOMS: "Do you have any other symptoms?" (e.g., chest pain, fever, cough, SOB, vomiting, diarrhea, bleeding, other areas of pain)     Chest tightness, feels like lungs are not expanding, intermittent headache, low grade temp 7. PREGNANCY: "Is there any chance you are pregnant?" "When was your last menstrual period?"     n/a  Protocols used: WEAKNESS (GENERALIZED) AND FATIGUE-A-AH

## 2019-03-08 ENCOUNTER — Encounter: Payer: Self-pay | Admitting: Internal Medicine

## 2019-03-08 ENCOUNTER — Other Ambulatory Visit: Payer: Self-pay | Admitting: Internal Medicine

## 2019-03-08 ENCOUNTER — Ambulatory Visit (INDEPENDENT_AMBULATORY_CARE_PROVIDER_SITE_OTHER): Payer: Managed Care, Other (non HMO) | Admitting: Internal Medicine

## 2019-03-08 DIAGNOSIS — R062 Wheezing: Secondary | ICD-10-CM

## 2019-03-08 DIAGNOSIS — J069 Acute upper respiratory infection, unspecified: Secondary | ICD-10-CM | POA: Diagnosis not present

## 2019-03-08 DIAGNOSIS — Z20822 Contact with and (suspected) exposure to covid-19: Secondary | ICD-10-CM

## 2019-03-08 DIAGNOSIS — R739 Hyperglycemia, unspecified: Secondary | ICD-10-CM | POA: Diagnosis not present

## 2019-03-08 DIAGNOSIS — Z20828 Contact with and (suspected) exposure to other viral communicable diseases: Secondary | ICD-10-CM

## 2019-03-08 MED ORDER — PREDNISONE 10 MG PO TABS: ORAL_TABLET | ORAL | 0 refills | Status: DC

## 2019-03-08 MED ORDER — HYDROCODONE-HOMATROPINE 5-1.5 MG/5ML PO SYRP: 5.0000 mL | ORAL_SOLUTION | Freq: Four times a day (QID) | ORAL | 0 refills | Status: AC | PRN

## 2019-03-08 MED ORDER — FLUTICASONE-SALMETEROL 250-50 MCG/DOSE IN AEPB: 1.0000 | INHALATION_SPRAY | Freq: Two times a day (BID) | RESPIRATORY_TRACT | 11 refills | Status: DC

## 2019-03-08 MED ORDER — HYDROCODONE-HOMATROPINE 5-1.5 MG/5ML PO SYRP: 5.0000 mL | ORAL_SOLUTION | Freq: Four times a day (QID) | ORAL | 0 refills | Status: DC | PRN

## 2019-03-08 NOTE — Patient Instructions (Addendum)
Please take all new medication as prescribed - the advair, prednisone, and cough medicine as needed  Please continue all other medications as before, and refills have been done if requested.  Please have the pharmacy call with any other refills you may need.  Please keep your appointments with your specialists as you may have planned  Please to go the [facility] site for repeat COVID testing  Please go to ED for any worsening, especially shortness of breath  Note for work is given

## 2019-03-08 NOTE — Progress Notes (Signed)
Patient ID: Zachary Riley, male   DOB: 01-01-1966, 53 y.o.   MRN: [id number]  Virtual Visit via Video Note  I connected with Zachary Riley on 03/08/19 at  8:40 AM EDT by a video enabled telemedicine application and verified that I am speaking with the correct person using two identifiers.  Location: Patient: at home Provider: at office   I discussed the limitations of evaluation and management by telemedicine and the availability of in person appointments. The patient expressed understanding and agreed to proceed.  History of Present Illness: Here with 5 days onset HA, feeling poorly, worst was yesterday, low grade temp 99, prod cough clear without color, but sometimes so hard he has dry heaves, also nose congestion with clear d/c; + ST, sob and onset mild wheeziness and tightness, using inhaler twice already today.  [name] is ill as well, she has had contact with one known positive, and pt have been exposed to 3 known positive.  She is now on 14 day quarantine per work  He did have neg covid testing done last wed, now over 1 wk, and had another exposure tues this week. Pt denies chest pain, orthopnea, PND, increased LE swelling, palpitations, dizziness or syncope.   Pt denies polydipsia, polyuria,  Past Medical History:  Diagnosis Date  . ALLERGIC RHINITIS 02/14/2008  . ANXIETY 02/14/2008  . BACK PAIN 02/18/2009  . COMMON MIGRAINE 02/18/2009  . DEPRESSION 02/14/2008  . GERD 02/14/2008  . HEMATOCHEZIA 04/29/2010  . HYPERLIPIDEMIA 02/14/2008  . IBS 02/14/2008  . INGUINAL HERNIA 02/14/2008  . OTITIS MEDIA, ACUTE, LEFT 03/02/2010  . SINUSITIS- ACUTE-NOS 11/05/2009  . URI 08/27/2008   Past Surgical History:  Procedure Laterality Date  . s/p [facility] 7/09      reports that he has quit smoking. He has never used smokeless tobacco. He reports current alcohol use. He reports that he does not use drugs. family history includes Cancer in his father, mother, and another family member; Diabetes in an other  family member; Hypertension in an other family member. Allergies  Allergen Reactions  . Esomeprazole Magnesium     REACTION: per patient  . Penicillins     REACTION: GI upset   Current Outpatient Medications on File Prior to Visit  Medication Sig Dispense Refill  . albuterol (PROVENTIL HFA;VENTOLIN HFA) 108 (90 Base) MCG/ACT inhaler Inhale 2 puffs into the lungs every 6 (six) hours as needed for wheezing or shortness of breath. 1 Inhaler 1  . ALPRAZolam (XANAX) 1 MG tablet Take 1 tablet by mouth three times daily as needed 90 tablet 1  . cyclobenzaprine (FLEXERIL) 5 MG tablet Take 1 tablet (5 mg total) by mouth 3 (three) times daily as needed for muscle spasms. 30 tablet 1  . naproxen (NAPROSYN) 500 MG tablet Take 1 tablet (500 mg total) by mouth 2 (two) times daily with a meal. As neede for pain 180 tablet 1   No current facility-administered medications on file prior to visit.    Observations/Objective: Alert, NAD, mild ill appaering, appropriate mood and affect, resps normal, cn 2-12 intact, moves all 4s, no visible rash or swelling Lab Results  Component Value Date   WBC 7.2 08/01/2018   HGB 15.8 08/01/2018   HCT 46.0 08/01/2018   PLT 211.0 08/01/2018   GLUCOSE 105 (H) 08/01/2018   CHOL 221 (H) 08/01/2018   TRIG 106.0 08/01/2018   HDL 58.80 08/01/2018   LDLCALC 141 (H) 08/01/2018   ALT 28 08/01/2018   AST 21  08/01/2018   NA 140 08/01/2018   K 4.5 08/01/2018   CL 104 08/01/2018   CREATININE 0.79 08/01/2018   BUN 19 08/01/2018   CO2 29 08/01/2018   TSH 1.37 08/01/2018   PSA 0.59 08/01/2018   HGBA1C 5.5 08/01/2018   Assessment and Plan: See notes  Follow Up Instructions: See notes   I discussed the assessment and treatment plan with the patient. The patient was provided an opportunity to ask questions and all were answered. The patient agreed with the plan and demonstrated an understanding of the instructions.   The patient was advised to call back or seek an  in-person evaluation if the symptoms worsen or if the condition fails to improve as anticipated.   Oliver BarreJames Ashlie Mcmenamy, MD

## 2019-03-09 ENCOUNTER — Encounter: Payer: Self-pay | Admitting: Internal Medicine

## 2019-03-09 ENCOUNTER — Telehealth: Payer: Self-pay | Admitting: *Deleted

## 2019-03-09 ENCOUNTER — Encounter: Payer: Self-pay | Admitting: Family

## 2019-03-09 LAB — NOVEL CORONAVIRUS, NAA: SARS-CoV-2, NAA: NOT DETECTED

## 2019-03-09 NOTE — Telephone Encounter (Signed)
Copied from [place] [id number]. Topic: General - Other >> Mar 09, 2019  9:51 AM Yvette Rack wrote: Reason for CRM: Pt stated he needs a doctor's note regarding his virtual appt yesterday. Pt stated his supervisor has asked him for the doctor's note 3 times and he needs it asap/ Pt asked that the doctor's note be updated on his [facility] account since he is in quarantine.

## 2019-03-09 NOTE — Telephone Encounter (Signed)
Letter reprinted to Wilson Singer to call pt regarding how to get this to him - ? Pick up in office, upload on mychart or fax?

## 2019-03-09 NOTE — Telephone Encounter (Signed)
Copy of excuse letter copied and sent to patient via [place]. See 03/09/19 patient message. Pt informed.

## 2019-03-11 ENCOUNTER — Encounter: Payer: Self-pay | Admitting: Internal Medicine

## 2019-03-11 NOTE — Assessment & Plan Note (Signed)
Mild to mod, for tx as above,  to f/u any worsening symptoms or concerns 

## 2019-03-11 NOTE — Assessment & Plan Note (Signed)
Mild to mod, c/w viral illness, for COVID test referral, for advair, predpac asd, cough med prn, to f/u any worsening symptoms or concerns

## 2019-03-11 NOTE — Assessment & Plan Note (Signed)
stable overall by history and exam, recent data reviewed with pt, and pt to continue medical treatment as before,  to f/u any worsening symptoms or concerns  

## 2019-03-12 NOTE — Telephone Encounter (Signed)
Patient had a [facility] appointment on 7/16.

## 2019-03-13 ENCOUNTER — Encounter: Payer: Self-pay | Admitting: Internal Medicine

## 2019-03-13 MED ORDER — AZITHROMYCIN 250 MG PO TABS: ORAL_TABLET | ORAL | 1 refills | Status: DC

## 2019-03-14 ENCOUNTER — Other Ambulatory Visit: Payer: Self-pay | Admitting: Internal Medicine

## 2019-03-14 NOTE — Telephone Encounter (Signed)
Done erx 

## 2019-03-21 ENCOUNTER — Emergency Department (HOSPITAL_COMMUNITY): Payer: Managed Care, Other (non HMO)

## 2019-03-21 ENCOUNTER — Other Ambulatory Visit: Payer: Self-pay

## 2019-03-21 ENCOUNTER — Emergency Department (HOSPITAL_COMMUNITY)
Admission: EM | Admit: 2019-03-21 | Discharge: 2019-03-21 | Disposition: A | Discharge status: Home / Self Care | Payer: Managed Care, Other (non HMO) | Attending: Emergency Medicine | Admitting: Emergency Medicine

## 2019-03-21 ENCOUNTER — Encounter (HOSPITAL_COMMUNITY): Payer: Self-pay | Admitting: Emergency Medicine

## 2019-03-21 ENCOUNTER — Ambulatory Visit: Payer: Self-pay | Admitting: Internal Medicine

## 2019-03-21 DIAGNOSIS — H9209 Otalgia, unspecified ear: Secondary | ICD-10-CM | POA: Insufficient documentation

## 2019-03-21 DIAGNOSIS — Z79899 Other long term (current) drug therapy: Secondary | ICD-10-CM | POA: Insufficient documentation

## 2019-03-21 DIAGNOSIS — R6883 Chills (without fever): Secondary | ICD-10-CM | POA: Insufficient documentation

## 2019-03-21 DIAGNOSIS — R0981 Nasal congestion: Secondary | ICD-10-CM | POA: Insufficient documentation

## 2019-03-21 DIAGNOSIS — R438 Other disturbances of smell and taste: Secondary | ICD-10-CM | POA: Diagnosis not present

## 2019-03-21 DIAGNOSIS — Z20828 Contact with and (suspected) exposure to other viral communicable diseases: Secondary | ICD-10-CM | POA: Insufficient documentation

## 2019-03-21 DIAGNOSIS — R05 Cough: Secondary | ICD-10-CM | POA: Insufficient documentation

## 2019-03-21 DIAGNOSIS — R202 Paresthesia of skin: Secondary | ICD-10-CM | POA: Diagnosis not present

## 2019-03-21 DIAGNOSIS — R5381 Other malaise: Secondary | ICD-10-CM | POA: Insufficient documentation

## 2019-03-21 DIAGNOSIS — F419 Anxiety disorder, unspecified: Secondary | ICD-10-CM | POA: Insufficient documentation

## 2019-03-21 DIAGNOSIS — Z87891 Personal history of nicotine dependence: Secondary | ICD-10-CM | POA: Diagnosis not present

## 2019-03-21 DIAGNOSIS — R030 Elevated blood-pressure reading, without diagnosis of hypertension: Secondary | ICD-10-CM | POA: Insufficient documentation

## 2019-03-21 DIAGNOSIS — R197 Diarrhea, unspecified: Secondary | ICD-10-CM

## 2019-03-21 DIAGNOSIS — R0602 Shortness of breath: Secondary | ICD-10-CM | POA: Diagnosis not present

## 2019-03-21 DIAGNOSIS — R110 Nausea: Secondary | ICD-10-CM | POA: Insufficient documentation

## 2019-03-21 DIAGNOSIS — R064 Hyperventilation: Secondary | ICD-10-CM | POA: Insufficient documentation

## 2019-03-21 LAB — COMPREHENSIVE METABOLIC PANEL
ALT: 16 U/L (ref 0–44)
AST: 14 U/L — ABNORMAL LOW (ref 15–41)
Albumin: 4.5 g/dL (ref 3.5–5.0)
Alkaline Phosphatase: 69 U/L (ref 38–126)
Anion gap: 9 (ref 5–15)
BUN: 16 mg/dL (ref 6–20)
CO2: 25 mmol/L (ref 22–32)
Calcium: 9.2 mg/dL (ref 8.9–10.3)
Chloride: 107 mmol/L (ref 98–111)
Creatinine, Ser: 0.74 mg/dL (ref 0.61–1.24)
GFR calc Af Amer: >60 mL/min (ref >60)
GFR calc non Af Amer: >60 mL/min (ref >60)
Glucose, Bld: 98 mg/dL (ref 70–99)
Potassium: 4.3 mmol/L (ref 3.5–5.1)
Sodium: 141 mmol/L (ref 135–145)
Total Bilirubin: 0.9 mg/dL (ref 0.3–1.2)
Total Protein: 7.7 g/dL (ref 6.5–8.1)

## 2019-03-21 LAB — CBC
HCT: 49.9 % (ref 39.0–52.0)
Hemoglobin: 16.4 g/dL (ref 13.0–17.0)
MCH: 30.1 pg (ref 26.0–34.0)
MCHC: 32.9 g/dL (ref 30.0–36.0)
MCV: 91.6 fL (ref 80.0–100.0)
Platelets: 290 10*3/uL (ref 150–400)
RBC: 5.45 MIL/uL (ref 4.22–5.81)
RDW: 12.8 % (ref 11.5–15.5)
WBC: 10.8 10*3/uL — ABNORMAL HIGH (ref 4.0–10.5)
nRBC: 0 % (ref 0.0–0.2)

## 2019-03-21 MED ORDER — FLUTICASONE PROPIONATE 50 MCG/ACT NA SUSP: 1.0000 | Freq: Every day | NASAL | 0 refills | Status: DC

## 2019-03-21 MED ORDER — BENZONATATE 100 MG PO CAPS: 100.0000 mg | ORAL_CAPSULE | Freq: Three times a day (TID) | ORAL | 0 refills | Status: DC

## 2019-03-21 NOTE — ED Provider Notes (Signed)
[facility]-EMERGENCY DEPT Provider Note   CSN: [id number] Arrival date & time: 03/21/19  0831     History   Chief Complaint Chief Complaint  Patient presents with  . Shortness of Breath  . Diarrhea    HPI Zachary Riley is a 53 y.o. male with a hx of migraines, hyperlipidemia, anxiety, & depression who presents to the emergency department with complaints of dyspnea and diarrhea over the past 24 hours.  Patient states that he started generally not feeling well yesterday with nausea, some mild trouble breathing, nasal congestion, cough with clear sputum, ear pressure, chills, & a few episodes of loose stools that were nonbloody.  He states this morning that he went to work and he felt like his breathing became increasingly labored, he began hyperventilating, and started having tingling to the distal aspect of the extremities x 4-this concerned him so he decided to call his PCP who recommended he come to the ER.  He states at present his symptoms are overall improved.  He states he still does not feel quite right.  He also notes loss of taste and smell, he is concern for COVID-19, he has been tested twice in the past 3 weeks, most recently a week and a half ago, there was an exposure at work.  Denies fever, sore throat, chest pain, dizziness, passing out, vomiting, hematemesis, melena, hematochezia, abdominal pain, dysuria, leg pain/swelling, hemoptysis, recent surgery/trauma, recent long travel, hormone use, personal hx of cancer, or hx of DVT/PE. Denies SI/HI. No known tic exposures.      HPI  Past Medical History:  Diagnosis Date  . ALLERGIC RHINITIS 02/14/2008  . ANXIETY 02/14/2008  . BACK PAIN 02/18/2009  . COMMON MIGRAINE 02/18/2009  . DEPRESSION 02/14/2008  . GERD 02/14/2008  . HEMATOCHEZIA 04/29/2010  . HYPERLIPIDEMIA 02/14/2008  . IBS 02/14/2008  . INGUINAL HERNIA 02/14/2008  . OTITIS MEDIA, ACUTE, LEFT 03/02/2010  . SINUSITIS- ACUTE-NOS 11/05/2009  . URI 08/27/2008     Patient Active Problem List   Diagnosis Date Noted  . Wheezing 08/25/2018  . Acute upper respiratory infection 05/26/2017  . Lower back pain 06/26/2015  . Hyperglycemia 06/26/2015  . Onychomycosis 06/07/2014  . Carpal tunnel syndrome 07/03/2013  . Encounter for well adult exam with abnormal findings 07/17/2012  . HEMATOCHEZIA 04/29/2010  . COMMON MIGRAINE 02/18/2009  . Backache 02/18/2009  . Hyperlipidemia 02/14/2008  . Anxiety state 02/14/2008  . Depression 02/14/2008  . ALLERGIC RHINITIS 02/14/2008  . GERD 02/14/2008  . INGUINAL HERNIA 02/14/2008  . IBS 02/14/2008    Past [facility] History:  [facility]  [facility]          Home Medications    Prior to Admission medications   Medication Sig Start Date End Date Taking? Authorizing Provider  albuterol (PROVENTIL HFA;VENTOLIN HFA) 108 (90 Base) MCG/ACT inhaler Inhale 2 puffs into the lungs every 6 (six) hours as needed for wheezing or shortness of breath. 08/01/18   Corwin LevinsJohn, Bonner W, MD  ALPRAZolam Prudy Feeler(XANAX) 1 MG tablet Take 1 tablet by mouth three times daily as needed 03/14/19   Corwin LevinsJohn, Chike W, MD  azithromycin [facility](ZITHROMAX) 250 MG tablet 2 tab by mouth day1 , then 1 per day 03/13/19   Corwin LevinsJohn, Vandy W, MD  cyclobenzaprine (FLEXERIL) 5 MG tablet Take 1 tablet (5 mg total) by mouth 3 (three) times daily as needed for muscle spasms. 08/01/18   Corwin LevinsJohn, Romelo W, MD  Fluticasone-Salmeterol (ADVAIR DISKUS) 250-50 MCG/DOSE AEPB Inhale  1 puff into the lungs 2 (two) times daily. 03/08/19 03/07/20  Corwin LevinsJohn, Rajesh W, MD  naproxen (NAPROSYN) 500 MG tablet Take 1 tablet (500 mg total) by mouth 2 (two) times daily with a meal. As neede for pain 08/01/18   Corwin LevinsJohn, Avyay W, MD  predniSONE (DELTASONE) 10 MG tablet 3 tabs by mouth per day for 3 days,2 tabs per day for 3 days,1 tab per day for 3 days 03/08/19   Corwin LevinsJohn, Bralon W, MD    Family History Family History  Problem Relation Age of Onset  . Cancer Mother        colon  . Cancer Father         colon  . Cancer Other        lung  . Diabetes Other   . Hypertension Other     Social History Social History   Tobacco Use  . Smoking status: Former Games developermoker  . Smokeless tobacco: Never Used  Substance Use Topics  . Alcohol use: Yes  . Drug use: No     Allergies   Esomeprazole magnesium and Penicillins   Review of Systems Review of Systems  Constitutional: Positive for chills and fatigue. Negative for fever.  HENT: Positive for congestion and ear pain. Negative for ear discharge and sore throat.   Eyes: Negative for visual disturbance.  Respiratory: Positive for cough and shortness of breath.   Cardiovascular: Negative for chest pain, palpitations and leg swelling.  Gastrointestinal: Positive for diarrhea and nausea. Negative for abdominal pain, anal bleeding, blood in stool, constipation and vomiting.  Genitourinary: Negative for dysuria.  Neurological: Negative for dizziness, syncope, weakness and headaches.       + for distal extremity paresthesias.   All other systems reviewed and are negative.    Physical Exam Updated Vital Signs BP (!) 114/98 (BP Location: Left Arm)   Pulse 89   Temp 98.1 F (36.7 C) (Oral)   Resp (!) 24   SpO2 97%   Physical Exam Vitals signs and nursing note reviewed.  Constitutional:      General: He is not in acute distress.    Appearance: He is well-developed. He is not ill-appearing or toxic-appearing.  HENT:     Head: Normocephalic and atraumatic.     Right Ear: Ear canal and external ear normal. No mastoid tenderness. Tympanic membrane is not perforated, erythematous, retracted or bulging.     Left Ear: Ear canal and external ear normal. No mastoid tenderness. Tympanic membrane is not perforated, erythematous, retracted or bulging.     Ears:     Comments: No mastoid erythema, swelling, or tenderness.    Nose: Congestion present.     Right Sinus: No maxillary sinus tenderness or frontal sinus tenderness.     Left Sinus: No  maxillary sinus tenderness or frontal sinus tenderness.     Mouth/Throat:     Mouth: Mucous membranes are moist.     Pharynx: Oropharynx is clear. Uvula midline.  Eyes:     General:        Right eye: No discharge.        Left eye: No discharge.     Extraocular Movements: Extraocular movements intact.     Conjunctiva/sclera: Conjunctivae normal.  Neck:     Musculoskeletal: Normal range of motion and neck supple. No neck rigidity.  Cardiovascular:     Rate and Rhythm: Normal rate and regular rhythm.     Pulses:          Radial  pulses are 2+ on the right side and 2+ on the left side.       Posterior tibial pulses are 2+ on the right side and 2+ on the left side.  Pulmonary:     Effort: Pulmonary effort is normal. No respiratory distress.     Breath sounds: Normal breath sounds. No wheezing, rhonchi or rales.  Abdominal:     General: There is no distension.     Palpations: Abdomen is soft.     Tenderness: There is no abdominal tenderness. There is no guarding or rebound.  Musculoskeletal:     Right lower leg: No edema.     Left lower leg: No edema.  Lymphadenopathy:     Cervical: No cervical adenopathy.  Skin:    General: Skin is warm and dry.     Findings: No rash.  Neurological:     Mental Status: He is alert.     Comments: Clear speech.  CN III through XII grossly intact.  Sensation grossly intact bilateral upper and lower extremities.  5 out of 5 symmetric grip strength.  5 out of 5 plantarflexion bilaterally. Normal finger to nose. Negative pronator drift. Ambulatory.   Psychiatric:        Behavior: Behavior normal.        Thought Content: Thought content does not include homicidal or suicidal ideation.     Comments: Patient seems somewhat anxious.     ED Treatments / Results  Labs (all labs ordered are listed, but only abnormal results are displayed) Labs Reviewed  CBC - Abnormal; Notable for the following components:      Result Value   WBC 10.8 (*)    All other  components within normal limits  COMPREHENSIVE METABOLIC PANEL - Abnormal; Notable for the following components:   AST 14 (*)    All other components within normal limits    EKG EKG Interpretation  Date/Time:  Wednesday March 21 2019 12:57:38 EDT Ventricular Rate:  69 PR Interval:    QRS Duration: 87 QT Interval:  403 QTC Calculation: 432 R Axis:   2 Text Interpretation:  Sinus rhythm Confirmed by Virgina NorfolkAdam, [place] [phone]([id number]) on 03/21/2019 1:42:23 PM   Radiology Dg Chest Portable 1 View  Result Date: 03/21/2019 CLINICAL DATA:  Shortness of breath EXAM: PORTABLE CHEST 1 VIEW COMPARISON:  None. FINDINGS: Lungs are clear. Heart size and pulmonary vascularity are normal. No adenopathy. No bone lesions. IMPRESSION: No edema or consolidation. Electronically Signed   By: Bretta BangWilliam  Woodruff III M.D.   On: 03/21/2019 13:11    Procedures Procedures (including critical care time)  Medications Ordered in ED Medications - No data to display   Initial Impression / Assessment and Plan / ED Course  I have reviewed the triage vital signs and the nursing notes.  Pertinent labs & imaging results that were available during my care of the patient were reviewed by me and considered in my medical decision making (see chart for details).   Patient presents to the emergency department with complaints of URI symptoms, cough, trouble breathing, as well as nausea and diarrhea over the past 24 hours.  He had an episode with increased work of breathing, hyperventilating, and paresthesias to his distal extremities, no syncope occurred.  Upon emergency department arrival he is nontoxic-appearing, no apparent distress, vitals WNL with the exception of mildly elevated blood pressure, doubt HTN emergency, initially documented tachypnea resolved on my exam.   Patient is afebrile in the ED, lungs are CTA, CXR negative  for infiltrate, doubt pneumonia. There is no wheezing or signs of respiratory distress. Sxs onset < 7  days, afebrile, no sinus tenderness, doubt acute bacterial sinusitis. Centor score 0, doubt strep pharyngitis. No evidence of AOM or mastoiditis on exam. No meningeal signs. No history components or rashes to raise concern for tic borne illness.  Low risk Wells, doubt PE.  EKG with normal sinus rhythm, no chest pain, doubt ACS.  Labs are overall reassuring, mild nonspecific leukocytosis, otherwise unremarkable.  No significant electrolyte derangement or anemia.  Suspect viral in nature, there may also be a component of anxiety. Patient is concerned about covid 19 and the quality of his prior swabs, will repeat swab given his array of sxs over the past 24 hours, discussed quarantine. Supportive measures prescribed (already has inhaler @ home & no wheezing in the ED) w/ diet recommendations for diarrhea. . I discussed results, treatment plan, need for PCP follow-up, and return precautions with the patient. Provided opportunity for questions, patient confirmed understanding and is in agreement with plan.   Final Clinical Impressions(s) / ED Diagnoses   Final diagnoses:  Shortness of breath  Diarrhea, unspecified type    ED Discharge Orders         Ordered    fluticasone (FLONASE) 50 MCG/ACT nasal spray  Daily     03/21/19 1359    benzonatate (TESSALON) 100 MG capsule  Every 8 hours     03/21/19 1359           Zaryan Yakubov, [place] R, PA-C 03/21/19 1402    Lennice Sites, DO 03/21/19 1455

## 2019-03-21 NOTE — Telephone Encounter (Signed)
Noted  

## 2019-03-21 NOTE — Telephone Encounter (Signed)
Pt called in very short of breath with chills and tingling in both arms and legs.   I instructed him to call 911 however he replied,  "I'm on [place] now going to [facility]".   I told him to drive carefully and I was going let him go so he could concentrate on driving.   He was agreeable.   We ended the call.  I sent my notes to Dr. Gwynn Burly office so he would be aware of the ED referral.   Reason for Disposition . [1] MODERATE difficulty breathing (e.g., speaks in phrases, SOB even at rest, pulse 100-120) AND [2] NEW-onset or WORSE than normal  Answer Assessment - Initial Assessment Questions 1. RESPIRATORY STATUS: "Describe your breathing?" (e.g., wheezing, shortness of breath, unable to speak, severe coughing)      Started like I was hyperventilating.   I'm having chills   I'm having trouble breathing and my arms and legs are tingling.   I could hear he was very short of breath.   I instructed him to call 911 now.    He said,  "I'm on [place] now on my way to [facility]".    I said good.    We ended our call so he could concentrate on driving. 2. ONSET: "When did this breathing problem begin?"      Instructed to call 911.   He is on the way to [facility] as we are talking. 3. PATTERN "Does the difficult breathing come and go, or has it been constant since it started?"      *No Answer* 4. SEVERITY: "How bad is your breathing?" (e.g., mild, moderate, severe)    - MILD: No SOB at rest, mild SOB with walking, speaks normally in sentences, can lay down, no retractions, pulse < 100.    - MODERATE: SOB at rest, SOB with minimal exertion and prefers to sit, cannot lie down flat, speaks in phrases, mild retractions, audible wheezing, pulse 100-120.    - SEVERE: Very SOB at rest, speaks in single words, struggling to breathe, sitting hunched forward, retractions, pulse > 120      *No Answer* 5. RECURRENT SYMPTOM: "Have you had difficulty breathing before?" If so,  ask: "When was the last time?" and "What happened that time?"      *No Answer* 6. CARDIAC HISTORY: "Do you have any history of heart disease?" (e.g., heart attack, angina, bypass surgery, angioplasty)      *No Answer* 7. LUNG HISTORY: "Do you have any history of lung disease?"  (e.g., pulmonary embolus, asthma, emphysema)     *No Answer* 8. CAUSE: "What do you think is causing the breathing problem?"      *No Answer* 9. OTHER SYMPTOMS: "Do you have any other symptoms? (e.g., dizziness, runny nose, cough, chest pain, fever)     *No Answer* 10. PREGNANCY: "Is there any chance you are pregnant?" "When was your last menstrual period?"       *No Answer* 11. TRAVEL: "Have you traveled out of the country in the last month?" (e.g., travel history, exposures)       *No Answer*  Protocols used: BREATHING DIFFICULTY-A-AH

## 2019-03-21 NOTE — ED Triage Notes (Signed)
Pt reports this morning he hyperventilated and then started having body tingling and shakes. Reports that he is really cold. Has had two negative Covid tests.

## 2019-03-21 NOTE — Discharge Instructions (Addendum)
You were seen in the emergency department today for trouble breathing, diarrhea, and several other symptoms.  Your work-up was overall reassuring.  Your labs did not show any significant abnormalities, your white blood cell count was mildly high at 10.8, normal is 10.4 this can be rechecked by primary care within 1 to 2 weeks.  Your EKG was normal.  Your chest x-ray was normal.  You are sending you with Flonase to take for congestion and Tessalon to help with cough.    We have prescribed you new medication(s) today. Discuss the medications prescribed today with your pharmacist as they can have adverse effects and interactions with your other medicines including over the counter and prescribed medications. Seek medical evaluation if you start to experience new or abnormal symptoms after taking one of these medicines, seek care immediately if you start to experience difficulty breathing, feeling of your throat closing, facial swelling, or rash as these could be indications of a more serious allergic reaction   We have tested you for coronavirus. With coronavirus is to be instructed to quarantine for 14 days You may be able to discontinue self quarantine if the following conditions are met:   Persons with COVID-19 who have symptoms and were directed to care for themselves at home may discontinue home isolation under the  following conditions: - It has been at least 7 days have passed since symptoms first appeared. - AND at least 3 days (72 hours) have passed since recovery defined as resolution of fever without the use of fever-reducing medications and improvement in respiratory symptoms (e.g., cough, shortness of breath)  Please follow the below quarantine instructions.   Please follow up with primary care within 3-5 days for re-evaluation- call prior to going to the office to make them aware of your symptoms. Return to the ER for new or worsening symptoms including but not limited to increased work  of breathing, fever, chest pain, passing out, or any other concerns.       Person Under Monitoring Name: Zachary Riley  Location: [address] [place] [address]   Infection Prevention Recommendations for Individuals Confirmed to have, or Being Evaluated for, 2019 Novel Coronavirus (COVID-19) Infection Who Receive Care at Home  Individuals who are confirmed to have, or are being evaluated for, COVID-19 should follow the prevention steps below until a healthcare provider or local or state health department says they can return to normal activities.  Stay home except to get medical care You should restrict activities outside your home, except for getting medical care. Do not go to work, school, or public areas, and do not use public transportation or taxis.  Call ahead before visiting your doctor Before your medical appointment, call the healthcare provider and tell them that you have, or are being evaluated for, COVID-19 infection. This will help the healthcare providers office take steps to keep other people from getting infected. Ask your healthcare provider to call the local or state health department.  Monitor your symptoms Seek prompt medical attention if your illness is worsening (e.g., difficulty breathing). Before going to your medical appointment, call the healthcare provider and tell them that you have, or are being evaluated for, COVID-19 infection. Ask your healthcare provider to call the local or state health department.  Wear a facemask You should wear a facemask that covers your nose and mouth when you are in the same room with other people and when you visit a healthcare provider. People who live with or visit  you should also wear a facemask while they are in the same room with you.  Separate yourself from other people in your home As much as possible, you should stay in a different room from other people in your home. Also, you should use a  separate bathroom, if available.  Avoid sharing household items You should not share dishes, drinking glasses, cups, eating utensils, towels, bedding, or other items with other people in your home. After using these items, you should wash them thoroughly with soap and water.  Cover your coughs and sneezes Cover your mouth and nose with a tissue when you cough or sneeze, or you can cough or sneeze into your sleeve. Throw used tissues in a lined trash can, and immediately wash your hands with soap and water for at least 20 seconds or use an alcohol-based hand rub.  Wash your [facility] your hands often and thoroughly with soap and water for at least 20 seconds. You can use an alcohol-based hand sanitizer if soap and water are not available and if your hands are not visibly dirty. Avoid touching your eyes, nose, and mouth with unwashed hands.   Prevention Steps for Caregivers and Household Members of Individuals Confirmed to have, or Being Evaluated for, COVID-19 Infection Being Cared for in the Home  If you live with, or provide care at home for, a person confirmed to have, or being evaluated for, COVID-19 infection please follow these guidelines to prevent infection:  Follow healthcare providers instructions Make sure that you understand and can help the patient follow any healthcare provider instructions for all care.  Provide for the patients basic needs You should help the patient with basic needs in the home and provide support for getting groceries, prescriptions, and other personal needs.  Monitor the patients symptoms If they are getting sicker, call his or her medical provider and tell them that the patient has, or is being evaluated for, COVID-19 infection. This will help the healthcare providers office take steps to keep other people from getting infected. Ask the healthcare provider to call the local or state health department.  Limit the number of people who have  contact with the patient If possible, have only one caregiver for the patient. Other household members should stay in another home or place of residence. If this is not possible, they should stay in another room, or be separated from the patient as much as possible. Use a separate bathroom, if available. Restrict visitors who do not have an essential need to be in the home.  Keep older adults, very young children, and other sick people away from the patient Keep older adults, very young children, and those who have compromised immune systems or chronic health conditions away from the patient. This includes people with chronic heart, lung, or kidney conditions, diabetes, and cancer.  Ensure good ventilation Make sure that shared spaces in the home have good air flow, such as from an air conditioner or an opened window, weather permitting.  Wash your hands often Wash your hands often and thoroughly with soap and water for at least 20 seconds. You can use an alcohol based hand sanitizer if soap and water are not available and if your hands are not visibly dirty. Avoid touching your eyes, nose, and mouth with unwashed hands. Use disposable paper towels to dry your hands. If not available, use dedicated cloth towels and replace them when they become wet.  Wear a facemask and gloves Wear a disposable facemask at  all times in the room and gloves when you touch or have contact with the patients blood, body fluids, and/or secretions or excretions, such as sweat, saliva, sputum, nasal mucus, vomit, urine, or feces.  Ensure the mask fits over your nose and mouth tightly, and do not touch it during use. Throw out disposable facemasks and gloves after using them. Do not reuse. Wash your hands immediately after removing your facemask and gloves. If your personal clothing becomes contaminated, carefully remove clothing and launder. Wash your hands after handling contaminated clothing. Place all used  disposable facemasks, gloves, and other waste in a lined container before disposing them with other household waste. Remove gloves and wash your hands immediately after handling these items.  Do not share dishes, glasses, or other household items with the patient Avoid sharing household items. You should not share dishes, drinking glasses, cups, eating utensils, towels, bedding, or other items with a patient who is confirmed to have, or being evaluated for, COVID-19 infection. After the person uses these items, you should wash them thoroughly with soap and water.  Wash laundry thoroughly Immediately remove and wash clothes or bedding that have blood, body fluids, and/or secretions or excretions, such as sweat, saliva, sputum, nasal mucus, vomit, urine, or feces, on them. Wear gloves when handling laundry from the patient. Read and follow directions on labels of laundry or clothing items and detergent. In general, wash and dry with the warmest temperatures recommended on the label.  Clean all areas the individual has used often Clean all touchable surfaces, such as counters, tabletops, doorknobs, bathroom fixtures, toilets, phones, keyboards, tablets, and bedside tables, every day. Also, clean any surfaces that may have blood, body fluids, and/or secretions or excretions on them. Wear gloves when cleaning surfaces the patient has come in contact with. Use a diluted bleach solution (e.g., dilute bleach with 1 part bleach and 10 parts water) or a household disinfectant with a label that says EPA-registered for coronaviruses. To make a bleach solution at home, add 1 tablespoon of bleach to 1 quart (4 cups) of water. For a larger supply, add  cup of bleach to 1 gallon (16 cups) of water. Read labels of cleaning products and follow recommendations provided on product labels. Labels contain instructions for safe and effective use of the cleaning product including precautions you should take when applying  the product, such as wearing gloves or eye protection and making sure you have good ventilation during use of the product. Remove gloves and wash hands immediately after cleaning.  Monitor yourself for signs and symptoms of illness Caregivers and household members are considered close contacts, should monitor their health, and will be asked to limit movement outside of the home to the extent possible. Follow the monitoring steps for close contacts listed on the symptom monitoring form.   ? If you have additional questions, contact your local health department or call the epidemiologist on call at [phone] (available 24/7). ? This guidance is subject to change. For the most up-to-date guidance from [facility], please refer to their website: [email or web address]

## 2019-03-22 LAB — NOVEL CORONAVIRUS, NAA (HOSP ORDER, SEND-OUT TO REF LAB; TAT 18-24 HRS): SARS-CoV-2, NAA: NOT DETECTED

## 2019-06-26 ENCOUNTER — Other Ambulatory Visit: Payer: Self-pay | Admitting: Internal Medicine

## 2019-06-26 NOTE — Telephone Encounter (Signed)
Done erx 

## 2019-07-01 IMAGING — XA Imaging study
1 series · 1 of 1 positions shown · non-contrast
Comparison: none

CLINICAL DATA: Lumbosacral spondylosis without myelopathy

[Series 1: ortho standard · 1 of 1 slices shown]
[im 1/1]
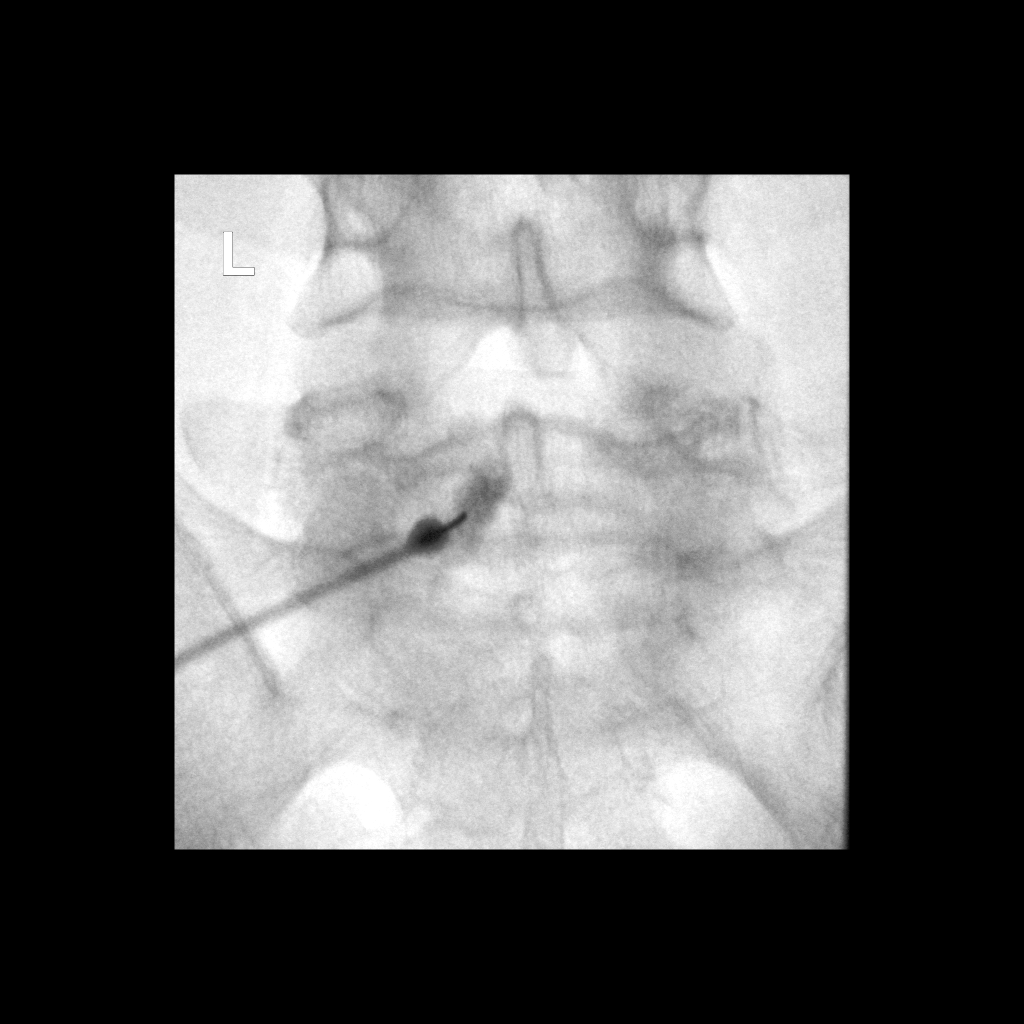

[1 of 1 positions shown; findings below may reference images not displayed]

FLUOROSCOPY TIME:  15 seconds

PROCEDURE:
The procedure, risks, benefits, and alternatives were explained to
the patient. Questions regarding the procedure were encouraged and
answered. The patient understands and consents to the procedure.

LUMBAR EPIDURAL INJECTION:

An interlaminar approach was performed on left at L5-S1. The
overlying skin was cleansed and anesthetized. A 20 gauge epidural
needle was advanced using loss-of-resistance technique.

DIAGNOSTIC EPIDURAL INJECTION:

Injection of Isovue-M 200 shows a good epidural pattern with spread
above and below the level of needle placement, primarily on the left
no vascular opacification is seen.

THERAPEUTIC EPIDURAL INJECTION:

One hundred twenty Mg of Depo-Medrol mixed with 3 cc 1% lidocaine
were instilled. The procedure was well-tolerated, and the patient
was discharged thirty minutes following the injection in good
condition.

COMPLICATIONS:
None
IMPRESSION: Technically successful epidural injection on the left L5-S1.

## 2019-07-11 ENCOUNTER — Other Ambulatory Visit: Payer: Self-pay

## 2019-07-11 DIAGNOSIS — Z20822 Contact with and (suspected) exposure to covid-19: Secondary | ICD-10-CM

## 2019-07-13 LAB — NOVEL CORONAVIRUS, NAA: SARS-CoV-2, NAA: NOT DETECTED

## 2019-08-20 ENCOUNTER — Encounter: Payer: Self-pay | Admitting: Internal Medicine

## 2019-08-20 NOTE — Telephone Encounter (Signed)
Please advise 

## 2019-09-19 ENCOUNTER — Encounter: Payer: Self-pay | Admitting: Internal Medicine

## 2019-09-20 MED ORDER — DOXYCYCLINE HYCLATE 100 MG PO TABS: 100.0000 mg | ORAL_TABLET | Freq: Two times a day (BID) | ORAL | 0 refills | Status: DC

## 2019-10-06 ENCOUNTER — Other Ambulatory Visit: Payer: Self-pay | Admitting: Internal Medicine

## 2019-10-06 NOTE — Telephone Encounter (Signed)
Done erx 

## 2019-10-18 ENCOUNTER — Encounter: Payer: Self-pay | Admitting: Internal Medicine

## 2019-10-18 DIAGNOSIS — J309 Allergic rhinitis, unspecified: Secondary | ICD-10-CM

## 2019-10-24 ENCOUNTER — Encounter: Payer: Self-pay | Admitting: Internal Medicine

## 2019-10-24 ENCOUNTER — Ambulatory Visit (INDEPENDENT_AMBULATORY_CARE_PROVIDER_SITE_OTHER): Payer: Managed Care, Other (non HMO) | Admitting: Internal Medicine

## 2019-10-24 DIAGNOSIS — R739 Hyperglycemia, unspecified: Secondary | ICD-10-CM | POA: Diagnosis not present

## 2019-10-24 DIAGNOSIS — F411 Generalized anxiety disorder: Secondary | ICD-10-CM

## 2019-10-24 DIAGNOSIS — R509 Fever, unspecified: Secondary | ICD-10-CM | POA: Diagnosis not present

## 2019-10-24 MED ORDER — PREDNISONE 10 MG PO TABS: ORAL_TABLET | ORAL | 0 refills | Status: DC

## 2019-10-24 MED ORDER — ONDANSETRON HCL 4 MG PO TABS: 4.0000 mg | ORAL_TABLET | Freq: Three times a day (TID) | ORAL | 1 refills | Status: DC | PRN

## 2019-10-24 MED ORDER — AZITHROMYCIN 250 MG PO TABS: ORAL_TABLET | ORAL | 1 refills | Status: DC

## 2019-10-24 NOTE — Patient Instructions (Signed)
Please take all new medication as prescribed  - the antibiotic, and prednisone, and zofran for nausea if needed  We will try to refer you to to the Respiratory Clinic for tomorrow as you mentioned  Please continue all other medications as before, and refills have been done if requested.  Please have the pharmacy call with any other refills you may need.  Please keep your appointments with your specialists as you may have planned

## 2019-10-24 NOTE — Progress Notes (Signed)
Patient ID: TIDUS UPCHURCH, male   DOB: [birth year], 54 y.o.   MRN: [id number]  Virtual Visit via Video Note  I connected with Zachary Riley on 10/24/19 at  4:00 PM EST by a video enabled telemedicine application and verified that I am speaking with the correct person using two identifiers.  Location: Patient: at home Provider:at office   I discussed the limitations of evaluation and management by telemedicine and the availability of in person appointments. The patient expressed understanding and agreed to proceed.  History of Present Illness:  Here with 2-3 days acute onset fever, facial pain, pressure, headache, general weakness and malaise, and clear d/c, with mild ST and cough, but pt denies chest pain, wheezing, increased sob or doe, orthopnea, PND, increased LE swelling, palpitations, dizziness or syncope. Also with nausea and several episodes non bloody diarrhea, no known hx of covid exposure. Denies worsening depressive symptoms, suicidal ideation, or panic  Pt denies polydipsia, polyuria Past Medical History:  Diagnosis Date  . ALLERGIC RHINITIS 02/14/2008  . ANXIETY 02/14/2008  . BACK PAIN 02/18/2009  . COMMON MIGRAINE 02/18/2009  . DEPRESSION 02/14/2008  . GERD 02/14/2008  . HEMATOCHEZIA 04/29/2010  . HYPERLIPIDEMIA 02/14/2008  . IBS 02/14/2008  . INGUINAL HERNIA 02/14/2008  . OTITIS MEDIA, ACUTE, LEFT 03/02/2010  . SINUSITIS- ACUTE-NOS 11/05/2009  . URI 08/27/2008   Past Surgical History:  Procedure Laterality Date  . s/p [facility] 7/09      reports that he has quit smoking. He has never used smokeless tobacco. He reports current alcohol use. He reports that he does not use drugs. family history includes Cancer in his father, mother, and another family member; Diabetes in an other family member; Hypertension in an other family member. Allergies  Allergen Reactions  . Esomeprazole Magnesium     REACTION: per patient  . Penicillins     REACTION: GI upset   Current Outpatient  Medications on File Prior to Visit  Medication Sig Dispense Refill  . albuterol (PROVENTIL HFA;VENTOLIN HFA) 108 (90 Base) MCG/ACT inhaler Inhale 2 puffs into the lungs every 6 (six) hours as needed for wheezing or shortness of breath. 1 Inhaler 1  . ALPRAZolam (XANAX) 1 MG tablet Take 1 tablet by mouth three times daily as needed 90 tablet 2  . benzonatate (TESSALON) 100 MG capsule Take 1 capsule (100 mg total) by mouth every 8 (eight) hours. 21 capsule 0  . cyclobenzaprine (FLEXERIL) 5 MG tablet Take 1 tablet (5 mg total) by mouth 3 (three) times daily as needed for muscle spasms. 30 tablet 1  . fluticasone (FLONASE) 50 MCG/ACT nasal spray Place 1 spray into both nostrils daily. 16 g 0  . Fluticasone-Salmeterol (ADVAIR DISKUS) 250-50 MCG/DOSE AEPB Inhale 1 puff into the lungs 2 (two) times daily. 1 each 11  . naproxen (NAPROSYN) 500 MG tablet Take 1 tablet (500 mg total) by mouth 2 (two) times daily with a meal. As neede for pain 180 tablet 1   No current facility-administered medications on file prior to visit.    Observations/Objective: Alert, NAD, appropriate mood and affect, resps normal, cn 2-12 intact, moves all 4s, no visible rash or swelling Lab Results  Component Value Date   WBC 10.8 (H) 03/21/2019   HGB 16.4 03/21/2019   HCT 49.9 03/21/2019   PLT 290 03/21/2019   GLUCOSE 98 03/21/2019   CHOL 221 (H) 08/01/2018   TRIG 106.0 08/01/2018   HDL 58.80 08/01/2018   LDLCALC 141 (H) 08/01/2018   ALT  16 03/21/2019   AST 14 (L) 03/21/2019   NA 141 03/21/2019   K 4.3 03/21/2019   CL 107 03/21/2019   CREATININE 0.74 03/21/2019   BUN 16 03/21/2019   CO2 25 03/21/2019   TSH 1.37 08/01/2018   PSA 0.59 08/01/2018   HGBA1C 5.5 08/01/2018   Assessment and Plan: See notes  Follow Up Instructions: See notes   I discussed the assessment and treatment plan with the patient. The patient was provided an opportunity to ask questions and all were answered. The patient agreed with the  plan and demonstrated an understanding of the instructions.   The patient was advised to call back or seek an in-person evaluation if the symptoms worsen or if the condition fails to improve as anticipated.  Oliver Barre, MD

## 2019-10-26 ENCOUNTER — Ambulatory Visit: Payer: Managed Care, Other (non HMO)

## 2019-10-28 ENCOUNTER — Encounter: Payer: Self-pay | Admitting: Internal Medicine

## 2019-10-28 DIAGNOSIS — R509 Fever, unspecified: Secondary | ICD-10-CM | POA: Insufficient documentation

## 2019-10-28 NOTE — Assessment & Plan Note (Signed)
Mild situiational, reassured, for tx and further evaluation s above, no other change in tx

## 2019-10-28 NOTE — Assessment & Plan Note (Addendum)
Etiology unclear, ? Bact vs possibly more likely viral, for zpack, predpack, zofran prn, and otc immodium prn, and refer to local respiratory clinic for COVID evaluation (tomorrow per pt)  I spent 31 minutes in preparing to see the patient by review of recent labs, imaging and procedures, obtaining and reviewing separately obtained history, communicating with the patient and family or caregiver, ordering medications, tests or procedures, and documenting clinical information in the EHR including the differential Dx, treatment, and any further evaluation and other management of febrile illness, hyperglycemia, anxiety

## 2019-10-28 NOTE — Assessment & Plan Note (Signed)
stable overall by history and exam, recent data reviewed with pt, and pt to continue medical treatment as before,  to f/u any worsening symptoms or concerns  

## 2019-11-12 ENCOUNTER — Other Ambulatory Visit: Payer: Self-pay

## 2019-11-12 ENCOUNTER — Ambulatory Visit: Payer: Managed Care, Other (non HMO) | Admitting: Allergy

## 2019-11-12 ENCOUNTER — Encounter: Payer: Self-pay | Admitting: Allergy

## 2019-11-12 ENCOUNTER — Telehealth: Payer: Self-pay

## 2019-11-12 VITALS — BP 110/72 | HR 78 | Temp 98.3°F | Resp 16 | Ht 67.0 in | Wt 178.4 lb

## 2019-11-12 DIAGNOSIS — R12 Heartburn: Secondary | ICD-10-CM | POA: Diagnosis not present

## 2019-11-12 DIAGNOSIS — Z87891 Personal history of nicotine dependence: Secondary | ICD-10-CM | POA: Diagnosis not present

## 2019-11-12 DIAGNOSIS — J3089 Other allergic rhinitis: Secondary | ICD-10-CM

## 2019-11-12 DIAGNOSIS — Z8709 Personal history of other diseases of the respiratory system: Secondary | ICD-10-CM | POA: Diagnosis not present

## 2019-11-12 DIAGNOSIS — J329 Chronic sinusitis, unspecified: Secondary | ICD-10-CM

## 2019-11-12 MED ORDER — AZELASTINE-FLUTICASONE 137-50 MCG/ACT NA SUSP: 1.0000 | Freq: Two times a day (BID) | NASAL | 5 refills | Status: DC

## 2019-11-12 NOTE — Assessment & Plan Note (Signed)
History of smoking with frequent bronchitis episodes.  Today's spirometry was normal.  Monitor symptoms.

## 2019-11-12 NOTE — Assessment & Plan Note (Signed)
   See assessment and plan as above for allergic rhinitis.  Get basic immune evaluation.  Keep track of infections.

## 2019-11-12 NOTE — Progress Notes (Signed)
New Patient Note  RE: Zachary Riley MRN: [id number] DOB: August 15, 1966 Date of Office Visit: 11/12/2019  Referring provider: Biagio Borg, MD Primary care provider: Biagio Borg, MD  Chief Complaint: Allergic Rhinitis   History of Present Illness: I had the pleasure of seeing Zachary Riley for initial evaluation at the [facility] on 11/12/2019. He is a 54 y.o. male, who is referred here by Biagio Borg, MD for the evaluation of allergic rhinitis.  Rhinitis: He reports symptoms of sinus infections, itchy/watery eyes, sinus pressure, congestion, sneezing, rhinorrhea, PND, itchy nose. Symptoms have been going on for a few years. The symptoms are present all year around with worsening in winter/spring. Other triggers include exposure to unknown. Anosmia: no. Headache: yes. He has used zyrtec, Flonase, nettipot with some improvement in symptoms. Allegra caused some jitteriness. Sinus infections: 6-7 last year and usually 2 bronchitis per year as well. Previous work up includes: none. Previous ENT evaluation: no. Previous sinus imaging: no. History of nasal polyps: no. Last eye exam: 2 yrs. History of reflux: yes and takes TUMS prn.  Assessment and Plan: Zachary Riley is a 54 y.o. male with: Other allergic rhinitis Perennial rhinoconjunctivitis symptoms for a few years with worsening in the winter and spring months.  Tried Zyrtec, Flonase and Nettie pot with some benefit.  Allegra caused some jitteriness.  No previous allergy testing or ENT evaluation.  Usually gets 6-7 sinus infections with 2 bronchitis per year.  No previous immune evaluation.  Today's skin testing showed: Positive to ragweed, mold.   Start environmental control measures.  Discussed with patient that above allergens do not explain his symptoms and frequent sinus infections.   May use over the counter antihistamines such as Zyrtec (cetirizine), Claritin (loratadine), or Xyzal (levocetirizine) daily as  needed.  Continue with nettipot 1-2 times a day.  Start dymista (fluticasone + azelastine nasal spray combination) 1 spray per nostril twice a day.  This replaces Flonase. If it's not covered by insurance let us know.   Refer to ENT for recurrent sinus infections.   Will get basic immune evaluation as well.   Recurrent sinusitis  See assessment and plan as above for allergic rhinitis.  Get basic immune evaluation.  Keep track of infections.   History of smoking History of smoking with frequent bronchitis episodes.  Today's spirometry was normal.  Monitor symptoms.   Heartburn Issues with heartburn. Takes tums prn. Most recently had a flare but patient was on antibiotics and prednisone which most likely caused the flare.   Gave handout on heartburn lifestyle.   Return in about 2 months (around 01/12/2020).  Meds ordered this encounter  Medications  . Azelastine-Fluticasone 137-50 MCG/ACT SUSP    Sig: Place 1 spray into the nose in the morning and at bedtime.    Dispense:  23 g    Refill:  5    Lab Orders     CBC with Differential/Platelet     IgG, IgA, IgM     Diphtheria / Tetanus Antibody Panel     Strep pneumoniae 23 Serotypes IgG  Other allergy screening: Asthma: no Rhino conjunctivitis: yes Food allergy: no Medication allergy: yes  Penicillin - GI symptoms Esomeprazole - not sure Hymenoptera allergy: no Urticaria: no Eczema:no History of recurrent infections suggestive of immunodeficency:  Patient has history multiple infections including sinus infection, bronchitis, ear infections, GI infections/diarrhea, skin infections/abscesses. Patient has no history of opportunistic infections including fungal infections, viral infections.  Patient reports 4-5 antibiotic use in the last 12 months and 0 hospital admissions. Patient does not have any secondary causes of immunodeficiency including chronic steroid use, diabetes mellitus, protein losing enteropathy,  renal or hepatic dysfunction, history of cancer or irradiation or history of HIV, hepatitis B or C.  Diagnostics: Spirometry:  Tracings reviewed. His effort: Good reproducible efforts. FVC: 4.32L FEV1: 3.78L, 108% predicted FEV1/FVC ratio: 88% Interpretation: Spirometry consistent with normal pattern.  Please see scanned spirometry results for details.  Skin Testing: Environmental allergy panel. Positive test to: ragweed and mold.  Results discussed with patient/family. Airborne Adult Perc - 11/12/19 0918    Time Antigen Placed  0254    Allergen Manufacturer  Waynette Buttery    Location  Back    Number of Test  59    Panel 1  Select    1. Control-Buffer 50% Glycerol  Negative    2. Control-Histamine 1 mg/ml  2+    3. Albumin saline  Negative    4. Bahia  Negative    5. French Southern Territories  Negative    6. Johnson  Negative    7. Kentucky Blue  Negative    8. Meadow Fescue  Negative    9. Perennial Rye  Negative    10. Sweet Vernal  Negative    11. Timothy  Negative    12. Cocklebur  Negative    13. Burweed Marshelder  Negative    14. Ragweed, short  3+    15. Ragweed, Giant  2+    16. Plantain,  English  Negative    17. Lamb's Quarters  Negative    18. Sheep Sorrell  Negative    19. Rough Pigweed  Negative    20. Marsh Elder, Rough  Negative    21. Mugwort, Common  Negative    22. Ash mix  Negative    23. Birch mix  Negative    24. Beech American  Negative    25. Box, Elder  Negative    26. Cedar, red  Negative    27. Cottonwood, Guinea-Bissau  Negative    28. Elm mix  Negative    29. Hickory mix  Negative    30. Maple mix  Negative    31. Oak, Guinea-Bissau mix  Negative    32. Pecan Pollen  Negative    33. Pine mix  Negative    34. Sycamore Eastern  Negative    35. Walnut, Black Pollen  Negative    36. Alternaria alternata  Negative    37. Cladosporium Herbarum  Negative    38. Aspergillus mix  Negative    39. Penicillium mix  Negative    40. Bipolaris sorokiniana (Helminthosporium)   Negative    41. Drechslera spicifera (Curvularia)  Negative    42. Mucor plumbeus  Negative    43. Fusarium moniliforme  2+    44. Aureobasidium pullulans (pullulara)  2+    45. Rhizopus oryzae  Negative    46. Botrytis cinera  Negative    47. Epicoccum nigrum  Negative    48. Phoma betae  Negative    49. Candida Albicans  Negative    50. Trichophyton mentagrophytes  2+    51. Mite, D Farinae  5,000 AU/ml  Negative    52. Mite, D Pteronyssinus  5,000 AU/ml  Negative    53. Cat Hair 10,000 BAU/ml  Negative    54.  Dog Epithelia  Negative    55. Mixed Feathers  Negative  56. Horse Epithelia  Negative    57. Cockroach, German  Negative    58. Mouse  Negative    59. Tobacco Leaf  Negative     Intradermal - 11/12/19 1015    Time Antigen Placed  1015    Allergen Manufacturer  Waynette Buttery    Location  Arm    Number of Test  13    Intradermal  Select    Control  Negative    French Southern Territories  Negative    Johnson  Negative    7 Grass  Negative    Weed mix  Negative    Tree mix  Negative    Mold 1  Negative    Mold 2  Negative    Mold 3  3+    Cat  Negative    Dog  Negative    Cockroach  Negative    Mite mix  Negative       Past Medical History: Patient Active Problem List   Diagnosis Date Noted  . History of frequent upper respiratory infection 11/12/2019  . Recurrent sinusitis 11/12/2019  . History of smoking 11/12/2019  . Heartburn 11/12/2019  . Febrile illness 10/28/2019  . Wheezing 08/25/2018  . Lower back pain 06/26/2015  . Hyperglycemia 06/26/2015  . Onychomycosis 06/07/2014  . Carpal tunnel syndrome 07/03/2013  . Encounter for well adult exam with abnormal findings 07/17/2012  . HEMATOCHEZIA 04/29/2010  . COMMON MIGRAINE 02/18/2009  . Backache 02/18/2009  . Hyperlipidemia 02/14/2008  . Anxiety state 02/14/2008  . Depression 02/14/2008  . Other allergic rhinitis 02/14/2008  . GERD 02/14/2008  . INGUINAL HERNIA 02/14/2008  . IBS 02/14/2008   Past Medical History:   Diagnosis Date  . ALLERGIC RHINITIS 02/14/2008  . ANXIETY 02/14/2008  . BACK PAIN 02/18/2009  . COMMON MIGRAINE 02/18/2009  . DEPRESSION 02/14/2008  . GERD 02/14/2008  . HEMATOCHEZIA 04/29/2010  . HYPERLIPIDEMIA 02/14/2008  . IBS 02/14/2008  . INGUINAL HERNIA 02/14/2008  . OTITIS MEDIA, ACUTE, LEFT 03/02/2010  . SINUSITIS- ACUTE-NOS 11/05/2009  . URI 08/27/2008   Past Surgical History: Past Surgical History:  Procedure Laterality Date  . s/p [facility] 7/09     Medication List:  Current Outpatient Medications  Medication Sig Dispense Refill  . ALPRAZolam (XANAX) 1 MG tablet Take 1 tablet by mouth three times daily as needed 90 tablet 2  . Azelastine-Fluticasone 137-50 MCG/ACT SUSP Place 1 spray into the nose in the morning and at bedtime. 23 g 5   No current facility-administered medications for this visit.   Allergies: Allergies  Allergen Reactions  . Esomeprazole Magnesium     REACTION: per patient  . Penicillins     REACTION: GI upset   Social History: Social History   Socioeconomic History  . Marital status: Married    Spouse name: Not on file  . Number of children: Not on file  . Years of education: Not on file  . Highest education level: Not on file  Occupational History  . Occupation: Psychologist, educational: UNEMPLOYED  Tobacco Use  . Smoking status: Former Games developer  . Smokeless tobacco: Never Used  Substance and Sexual Activity  . Alcohol use: Yes  . Drug use: No  . Sexual activity: Not on file  Other Topics Concern  . Not on file  Social History Narrative  . Not on file   Social Determinants of Health   Financial Resource Strain:   . Difficulty of Paying Living Expenses:   Food Insecurity:   .  Worried About Programme researcher, broadcasting/film/videounning Out of Food in the Last Year:   . Baristaan Out of Food in the Last Year:   Transportation Needs:   . Freight forwarderLack of Transportation (Medical):   Marland Kitchen. Lack of Transportation (Non-Medical):   Physical Activity:   . Days of Exercise per Week:   . Minutes of  Exercise per Session:   Stress:   . Feeling of Stress :   Social Connections:   . Frequency of Communication with Friends and Family:   . Frequency of Social Gatherings with Friends and Family:   . Attends Religious Services:   . Active Member of Clubs or Organizations:   . Attends BankerClub or Organization Meetings:   Marland Kitchen. Marital Status:    Lives in a 53 year old home. Smoking: denies, quit 16-17 years ago, 1 pack per day x 20 years Occupation: Interior and spatial designerwarehouse tech  Environmental History: ImmunologistWater Damage/mildew in the house: no Engineer, civil (consulting)Carpet in the family room: yes Carpet in the bedroom: yes Heating: electric Cooling: central Pet: yes 3 dgos x 14 yrs, 1 yr  Family History: Family History  Problem Relation Age of Onset  . Cancer Mother        colon  . Cancer Father        colon  . Cancer Other        lung  . Diabetes Other   . Hypertension Other    Problem                               Relation Asthma                                   No  Eczema                                No  Food allergy                          No  Allergic rhino conjunctivitis     No   Review of Systems  Constitutional: Negative for appetite change, chills, fever and unexpected weight change.  HENT: Positive for congestion, postnasal drip, rhinorrhea, sinus pressure and sneezing.   Eyes: Positive for itching.  Respiratory: Positive for cough. Negative for chest tightness, shortness of breath and wheezing.   Cardiovascular: Negative for chest pain.  Gastrointestinal: Negative for abdominal pain.  Genitourinary: Negative for difficulty urinating.  Skin: Negative for rash.  Allergic/Immunologic: Positive for environmental allergies. Negative for food allergies.  Neurological: Positive for headaches.   Objective: BP 110/72 (BP Location: Left Arm, Patient Position: Sitting, Cuff Size: Normal)   Pulse 78   Temp 98.3 F (36.8 C) (Temporal)   Resp 16   Ht 5\' 7"  (1.702 m)   Wt 178 lb 6.4 oz (80.9 kg)   SpO2 97%    BMI 27.94 kg/m  Body mass index is 27.94 kg/m. Physical Exam  Constitutional: He is oriented to person, place, and time. He appears well-developed and well-nourished.  HENT:  Head: Normocephalic and atraumatic.  Right Ear: External ear normal.  Left Ear: External ear normal.  Nose: Nose normal.  Mouth/Throat: Oropharynx is clear and moist.  Eyes: Conjunctivae and EOM are normal.  Cardiovascular: Normal rate, regular rhythm and normal heart sounds. Exam reveals no  gallop and no friction rub.  No murmur heard. Pulmonary/Chest: Effort normal and breath sounds normal. He has no wheezes. He has no rales.  Abdominal: Soft.  Musculoskeletal:     Cervical back: Neck supple.  Neurological: He is alert and oriented to person, place, and time.  Skin: Skin is warm. No rash noted.  Psychiatric: He has a normal mood and affect. His behavior is normal.  Nursing note and vitals reviewed.  The plan was reviewed with the patient/family, and all questions/concerned were addressed.  It was my pleasure to see Zachary Riley today and participate in his care. Please feel free to contact me with any questions or concerns.  Sincerely,  Wyline Mood, DO Allergy & Immunology  [facility] office: [phone] [facility] office: [phone] [place] office: [phone]

## 2019-11-12 NOTE — Telephone Encounter (Signed)
Please refer patient to ENT for frequent sinus infections

## 2019-11-12 NOTE — Assessment & Plan Note (Signed)
Perennial rhinoconjunctivitis symptoms for a few years with worsening in the winter and spring months.  Tried Zyrtec, Flonase and Nettie pot with some benefit.  Allegra caused some jitteriness.  No previous allergy testing or ENT evaluation.  Usually gets 6-7 sinus infections with 2 bronchitis per year.  No previous immune evaluation.  Today's skin testing showed: Positive to ragweed, mold.   Start environmental control measures.  Discussed with patient that above allergens do not explain his symptoms and frequent sinus infections.   May use over the counter antihistamines such as Zyrtec (cetirizine), Claritin (loratadine), or Xyzal (levocetirizine) daily as needed.  Continue with nettipot 1-2 times a day.  Start dymista (fluticasone + azelastine nasal spray combination) 1 spray per nostril twice a day.  This replaces Flonase. If it's not covered by insurance let us know.   Refer to ENT for recurrent sinus infections.   Will get basic immune evaluation as well.

## 2019-11-12 NOTE — Assessment & Plan Note (Signed)
Issues with heartburn. Takes tums prn. Most recently had a flare but patient was on antibiotics and prednisone which most likely caused the flare.   Gave handout on heartburn lifestyle.

## 2019-11-12 NOTE — Patient Instructions (Addendum)
Today's skin testing showed: Positive to ragweed, mold.   Environmental allergies:  Start environmental control measures.  May use over the counter antihistamines such as Zyrtec (cetirizine), Claritin (loratadine), or Xyzal (levocetirizine) daily as needed.  Continue with nettipot 1-2 times a day.  Start dymista (fluticasone + azelastine nasal spray combination) 1 spray per nostril twice a day.  This replaces Flonase. If it's not covered by insurance let us know.   Refer to ENT  Today's breathing test looked normal.  Keep track of infections. . Get bloodwork:  o We are ordering labs, so please allow 1-2 weeks for the results to come back. o With the newly implemented Cures Act, the labs might be visible to you at the same time that they become visible to me. However, I will not address the results until all of the results are back, so please be patient.   Heartburn:  See below for lifestyle modifications.   Follow up in 2 months or sooner if needed.   Reducing Pollen Exposure . Pollen seasons: trees (spring), grass (summer) and ragweed/weeds (fall). Marland Kitchen Keep windows closed in your home and car to lower pollen exposure.  Susa Simmonds air conditioning in the bedroom and throughout the house if possible.  . Avoid going out in dry windy days - especially early morning. . Pollen counts are highest between 5 - 10 AM and on dry, hot and windy days.  . Save outside activities for late afternoon or after a heavy rain, when pollen levels are lower.  . Avoid mowing of grass if you have grass pollen allergy. Marland Kitchen Be aware that pollen can also be transported indoors on people and pets.  . Dry your clothes in an automatic dryer rather than hanging them outside where they might collect pollen.  . Rinse hair and eyes before bedtime. Mold Control . Mold and fungi can grow on a variety of surfaces provided certain temperature and moisture conditions exist.  . Outdoor molds grow on plants, decaying  vegetation and soil. The major outdoor mold, Alternaria and Cladosporium, are found in very high numbers during hot and dry conditions. Generally, a late summer - fall peak is seen for common outdoor fungal spores. Rain will temporarily lower outdoor mold spore count, but counts rise rapidly when the rainy period ends. . The most important indoor molds are Aspergillus and Penicillium. Dark, humid and poorly ventilated basements are ideal sites for mold growth. The next most common sites of mold growth are the bathroom and the kitchen. Outdoor (Seasonal) Mold Control . Use air conditioning and keep windows closed. . Avoid exposure to decaying vegetation. Marland Kitchen Avoid leaf raking. . Avoid grain handling. . Consider wearing a face mask if working in moldy areas.  Indoor (Perennial) Mold Control  . Maintain humidity below 50%. . Get rid of mold growth on hard surfaces with water, detergent and, if necessary, 5% bleach (do not mix with other cleaners). Then dry the area completely. If mold covers an area more than 10 square feet, consider hiring an indoor environmental professional. . For clothing, washing with soap and water is best. If moldy items cannot be cleaned and dried, throw them away. . Remove sources e.g. contaminated carpets. . Repair and seal leaking roofs or pipes. Using dehumidifiers in damp basements may be helpful, but empty the water and clean units regularly to prevent mildew from forming. All rooms, especially basements, bathrooms and kitchens, require ventilation and cleaning to deter mold and mildew growth. Avoid carpeting on concrete  or damp floors, and storing items in damp areas.  Buffered Isotonic Saline Irrigations:  Goal: . When you irrigate with the isotonic saline (salt water) it washes mucous and other debris from your nose that could be contributing to your nasal symptoms.   Recipe: Marland Kitchen Obtain 1 quart jar that is clean . Fill with clean (bottled, boiled or distilled)  water . Add 1-2 heaping teaspoons of salt without iodine o If the solution with 2 teaspoons of salt is too strong, adjust the amount down until better tolerated . Add 1 teaspoon of Arm [employer] Hammer baking soda (pure bicarbonate) . Mix ingredients together and store at room temperature and discard after 1 week * Alternatively you can buy pre made salt packets for the NeilMed bottle or there          are other over the counter brands available  Instructions: . Warm  cup of the solution in the microwave if desired but be careful not to overheat as this will burn the inside of your nose . Stand over a sink (or do it while you shower) and squirt the solution into one side of your nose aiming towards the back of your head o Sometimes saying "coca cola" while irrigating can be helpful to prevent fluid from going down your throat  . The solution will travel to the back of your nose and then come out the other side . Perform this again on the other side . Try to do this twice a day . If you are using a nasal spray in addition to the irrigation, irrigate first and then use the topical nasal spray otherwise you will wash the nasal spray out of your nose   Heartburn Heartburn is a type of pain or discomfort that can happen in the throat or chest. It is often described as a burning pain. It may also cause a bad, acid-like taste in the mouth. Heartburn may feel worse when you lie down or bend over. It may be worse at night. It may be caused by stomach contents that move back up (reflux) into the tube that connects the mouth with the stomach (esophagus). Follow these instructions at home: Eating and drinking   Avoid certain foods and drinks as told by your doctor. This may include: ? Coffee and tea (with or without caffeine). ? Drinks that have alcohol. ? Energy drinks and sports drinks. ? Carbonated drinks or sodas. ? Chocolate and cocoa. ? Peppermint and mint flavorings. ? Garlic and  onions. ? Horseradish. ? Spicy and acidic foods, such as:  Peppers.  Chili powder and curry powder.  Vinegar.  Hot sauces and BBQ sauce. ? Citrus fruit juices and citrus fruits, such as:  Oranges.  Lemons.  Limes. ? Tomato-based foods, such as:  Red sauce and pizza with red sauce.  Chili.  Salsa. ? Fried and fatty foods, such as:  Donuts.  [place] fries and potato chips.  High-fat dressings. ? High-fat meats, such as:  Hot dogs and sausage.  Rib eye steak.  Ham and bacon. ? High-fat dairy items, such as:  Whole milk.  Butter.  Cream cheese.  Eat small meals often. Avoid eating large meals.  Avoid drinking large amounts of liquid with your meals.  Avoid eating meals during the 2-3 hours before bedtime.  Avoid lying down right after you eat.  Do not exercise right after you eat. Lifestyle      If you are overweight, lose an amount of weight that is  healthy for you. Ask your doctor about a safe weight loss goal.  Do not use any products that contain nicotine or tobacco, including cigarettes, e-cigarettes, and chewing tobacco. These can make your symptoms worse. If you need help quitting, ask your doctor.  Wear loose clothes. Do not wear anything tight around your waist.  Raise (elevate) the head of your bed about 6 inches (15 cm) when you sleep.  Try to lower your stress. If you need help doing this, ask your doctor. General instructions  Pay attention to any changes in your symptoms.  Take over-the-counter and prescription medicines only as told by your doctor. ? Do not take aspirin, ibuprofen, or other NSAIDs unless your doctor says it is okay. ? Stop medicines only as told by your doctor.  Keep all follow-up visits as told by your doctor. This is important. Contact a doctor if:  You have new symptoms.  You lose weight and you do not know why it is happening.  You have trouble swallowing, or it hurts to swallow.  You have wheezing  or a cough that keeps happening.  Your symptoms do not get better with treatment.  You have heartburn often for more than 2 weeks. Get help right away if:  You have pain in your arms, neck, jaw, teeth, or back.  You feel sweaty, dizzy, or light-headed.  You have chest pain or shortness of breath.  You throw up (vomit) and your throw up looks like blood or coffee grounds.  Your poop (stool) is bloody or black. These symptoms may represent a serious problem that is an emergency. Do not wait to see if the symptoms will go away. Get medical help right away. Call your local emergency services (911 in the [place] Do not drive yourself to the hospital. Summary  Heartburn is a type of pain that can happen in the throat or chest. It can feel like a burning pain. It may also cause a bad, acid-like taste in the mouth.  You may need to avoid certain foods and drinks to help your symptoms. Ask your doctor what foods and drinks you should avoid.  Take over-the-counter and prescription medicines only as told by your doctor. Do not take aspirin, ibuprofen, or other NSAIDs unless your doctor told you to do so.  Contact your doctor if your symptoms do not get better or they get worse. This information is not intended to replace advice given to you by your health care provider. Make sure you discuss any questions you have with your health care provider. Document Revised: 01/09/2018 Document Reviewed: 01/09/2018 Elsevier Patient Education  2020 [employer].

## 2019-11-14 NOTE — Telephone Encounter (Signed)
Referral placed to Dr Butch Penny office for scheduling/  Thanks

## 2019-11-16 NOTE — Telephone Encounter (Signed)
Thank you :)

## 2019-11-17 LAB — CBC WITH DIFFERENTIAL/PLATELET
Basophils Absolute: 0.1 10*3/uL (ref 0.0–0.2)
Basos: 1 %
EOS (ABSOLUTE): 0.1 10*3/uL (ref 0.0–0.4)
Eos: 1 %
Hematocrit: 49.2 % (ref 37.5–51.0)
Hemoglobin: 16.5 g/dL (ref 13.0–17.7)
Immature Grans (Abs): 0 10*3/uL (ref 0.0–0.1)
Immature Granulocytes: 1 %
Lymphocytes Absolute: 1.9 10*3/uL (ref 0.7–3.1)
Lymphs: 21 %
MCH: 30.2 pg (ref 26.6–33.0)
MCHC: 33.5 g/dL (ref 31.5–35.7)
MCV: 90 fL (ref 79–97)
Monocytes Absolute: 0.6 10*3/uL (ref 0.1–0.9)
Monocytes: 7 %
Neutrophils Absolute: 6.2 10*3/uL (ref 1.4–7.0)
Neutrophils: 69 %
Platelets: 262 10*3/uL (ref 150–450)
RBC: 5.47 x10E6/uL (ref 4.14–5.80)
RDW: 13.2 % (ref 11.6–15.4)
WBC: 8.9 10*3/uL (ref 3.4–10.8)

## 2019-11-17 LAB — DIPHTHERIA / TETANUS ANTIBODY PANEL
Diphtheria Ab: 0.64 IU/mL (ref <0.10)
Tetanus Ab, IgG: 6.32 IU/mL (ref <0.10)

## 2019-11-17 LAB — STREP PNEUMONIAE 23 SEROTYPES IGG
Pneumo Ab Type 1*: 2.3 ug/mL (ref >1.3)
Pneumo Ab Type 12 (12F)*: <0.1 ug/mL — ABNORMAL LOW (ref >1.3)
Pneumo Ab Type 14*: 4.8 ug/mL (ref >1.3)
Pneumo Ab Type 17 (17F)*: <0.1 ug/mL — ABNORMAL LOW (ref >1.3)
Pneumo Ab Type 19 (19F)*: 2.6 ug/mL (ref >1.3)
Pneumo Ab Type 2*: <0.1 ug/mL — ABNORMAL LOW (ref >1.3)
Pneumo Ab Type 20*: 9.3 ug/mL (ref >1.3)
Pneumo Ab Type 22 (22F)*: <0.1 ug/mL — ABNORMAL LOW (ref >1.3)
Pneumo Ab Type 23 (23F)*: 0.8 ug/mL — ABNORMAL LOW (ref >1.3)
Pneumo Ab Type 26 (6B)*: 0.5 ug/mL — ABNORMAL LOW (ref >1.3)
Pneumo Ab Type 3*: 2.6 ug/mL (ref >1.3)
Pneumo Ab Type 34 (10A)*: 0.5 ug/mL — ABNORMAL LOW (ref >1.3)
Pneumo Ab Type 4*: 0.7 ug/mL — ABNORMAL LOW (ref >1.3)
Pneumo Ab Type 43 (11A)*: 1 ug/mL — ABNORMAL LOW (ref >1.3)
Pneumo Ab Type 5*: <0.1 ug/mL — ABNORMAL LOW (ref >1.3)
Pneumo Ab Type 51 (7F)*: 5.6 ug/mL (ref >1.3)
Pneumo Ab Type 54 (15B)*: <0.1 ug/mL — ABNORMAL LOW (ref >1.3)
Pneumo Ab Type 56 (18C)*: 0.7 ug/mL — ABNORMAL LOW (ref >1.3)
Pneumo Ab Type 57 (19A)*: 2.6 ug/mL (ref >1.3)
Pneumo Ab Type 68 (9V)*: 1.1 ug/mL — ABNORMAL LOW (ref >1.3)
Pneumo Ab Type 70 (33F)*: 1.5 ug/mL (ref >1.3)
Pneumo Ab Type 8*: 0.8 ug/mL — ABNORMAL LOW (ref >1.3)
Pneumo Ab Type 9 (9N)*: 0.8 ug/mL — ABNORMAL LOW (ref >1.3)

## 2019-11-17 LAB — IGG, IGA, IGM
IgA/Immunoglobulin A, Serum: 227 mg/dL (ref 90–386)
IgG (Immunoglobin G), Serum: 770 mg/dL (ref 603–1613)
IgM (Immunoglobulin M), Srm: 150 mg/dL (ref 20–172)

## 2019-11-28 ENCOUNTER — Encounter (INDEPENDENT_AMBULATORY_CARE_PROVIDER_SITE_OTHER): Payer: Self-pay | Admitting: Otolaryngology

## 2019-11-28 ENCOUNTER — Other Ambulatory Visit: Payer: Self-pay

## 2019-11-28 ENCOUNTER — Ambulatory Visit (INDEPENDENT_AMBULATORY_CARE_PROVIDER_SITE_OTHER): Payer: Managed Care, Other (non HMO) | Admitting: Otolaryngology

## 2019-11-28 VITALS — Temp 97.5°F

## 2019-11-28 DIAGNOSIS — J310 Chronic rhinitis: Secondary | ICD-10-CM

## 2019-11-28 NOTE — Progress Notes (Signed)
HPI: Zachary Riley is a 54 y.o. male who presents is referred by his PCP for evaluation of recurrent sinus infections.  Apparently patient has had a history of recurrent sinus infections for a number of years.  This has been much worse the past 2 years.  He describes headache pressure and trouble breathing.  He was just recently started on a move nasal steroid spray that apparently has Flonase as well as azelastine and is consistent with probable Dymista.  He has been doing better since using this spray.  His breathing is doing better. He denies any purulent discharge from his nose. Of note he had a sinus CT scan performed in 2006 because of sinus problems and on review of this it demonstrated clear paranasal sinuses except for some mild thickening along the floor of the maxillary sinuses bilaterally..  Past Medical History:  Diagnosis Date  . ALLERGIC RHINITIS 02/14/2008  . ANXIETY 02/14/2008  . BACK PAIN 02/18/2009  . COMMON MIGRAINE 02/18/2009  . DEPRESSION 02/14/2008  . GERD 02/14/2008  . HEMATOCHEZIA 04/29/2010  . HYPERLIPIDEMIA 02/14/2008  . IBS 02/14/2008  . INGUINAL HERNIA 02/14/2008  . OTITIS MEDIA, ACUTE, LEFT 03/02/2010  . SINUSITIS- ACUTE-NOS 11/05/2009  . URI 08/27/2008   Past Surgical History:  Procedure Laterality Date  . s/p [facility] 7/09     Social History   Socioeconomic History  . Marital status: Married    Spouse name: Not on file  . Number of children: Not on file  . Years of education: Not on file  . Highest education level: Not on file  Occupational History  . Occupation: Psychologist, educational: UNEMPLOYED  Tobacco Use  . Smoking status: Former Games developer  . Smokeless tobacco: Never Used  Substance and Sexual Activity  . Alcohol use: Yes  . Drug use: No  . Sexual activity: Not on file  Other Topics Concern  . Not on file  Social History Narrative  . Not on file   Social Determinants of Health   Financial Resource Strain:   . Difficulty of Paying Living Expenses:    Food Insecurity:   . Worried About Programme researcher, broadcasting/film/video in the Last Year:   . Barista in the Last Year:   Transportation Needs:   . Freight forwarder (Medical):   Marland Kitchen Lack of Transportation (Non-Medical):   Physical Activity:   . Days of Exercise per Week:   . Minutes of Exercise per Session:   Stress:   . Feeling of Stress :   Social Connections:   . Frequency of Communication with Friends and Family:   . Frequency of Social Gatherings with Friends and Family:   . Attends Religious Services:   . Active Member of Clubs or Organizations:   . Attends Banker Meetings:   Marland Kitchen Marital Status:    Family History  Problem Relation Age of Onset  . Cancer Mother        colon  . Cancer Father        colon  . Cancer Other        lung  . Diabetes Other   . Hypertension Other    Allergies  Allergen Reactions  . Esomeprazole Magnesium     REACTION: per patient  . Penicillins     REACTION: GI upset   Prior to Admission medications   Medication Sig Start Date End Date Taking? Authorizing Provider  ALPRAZolam Prudy Feeler) 1 MG tablet Take 1 tablet by  mouth three times daily as needed 10/06/19  Yes Biagio Borg, MD  Azelastine-Fluticasone 137-50 MCG/ACT SUSP Place 1 spray into the nose in the morning and at bedtime. 11/12/19  Yes Garnet Sierras, DO     Positive ROS: Otherwise negative  All other systems have been reviewed and were otherwise negative with the exception of those mentioned in the HPI and as above.  Physical Exam: Constitutional: Alert, well-appearing, no acute distress Ears: External ears without lesions or tenderness. Ear canals are clear bilaterally with intact, clear TMs bilaterally. Nasal: External nose without lesions. Septum with only mild deformity.  Mild rhinitis.  After decongesting the nose both middle meatus regions were clear with no active drainage noted.  Nasal cavity is clear except for minimal clear mucus discharge. Oral: Lips and gums  without lesions. Tongue and palate mucosa without lesions. Posterior oropharynx clear.  No obvious postnasal drainage noted on the posterior oropharynx. Neck: No palpable adenopathy or masses Respiratory: Breathing comfortably  Skin: No facial/neck lesions or rash noted.  Procedures  Assessment: Chronic rhinitis with history of recurrent sinus infections.  Plan: Recommended regular use of the nasal steroid spray that he is presently using that seems to help.  Also discussed use of saline irrigation on a as needed basis for postnasal drainage. If he continues to get recurrent sinus infections would recommend repeating the CT scan and possible surgical intervention if warranted.  However exam is clear today. He will follow-up as needed further sinus infections for recheck as needed.    Radene Journey, MD   CC:

## 2020-01-08 ENCOUNTER — Other Ambulatory Visit: Payer: Self-pay | Admitting: Internal Medicine

## 2020-01-08 NOTE — Telephone Encounter (Signed)
Done erx 

## 2020-01-14 ENCOUNTER — Ambulatory Visit: Payer: Managed Care, Other (non HMO) | Admitting: Allergy

## 2020-01-16 IMAGING — XA Imaging study
2 series · 2 of 2 positions shown · non-contrast
Comparison: none

CLINICAL DATA: Lumbosacral spondylosis without myelopathy. Chronic
low back pain, left greater than right. The patient reports 75%
improvement for approximately 10 weeks following the last epidural
injection.

[Series 1: ortho standard · 1 of 1 slices shown (1 of 2)]
[im 1/1]
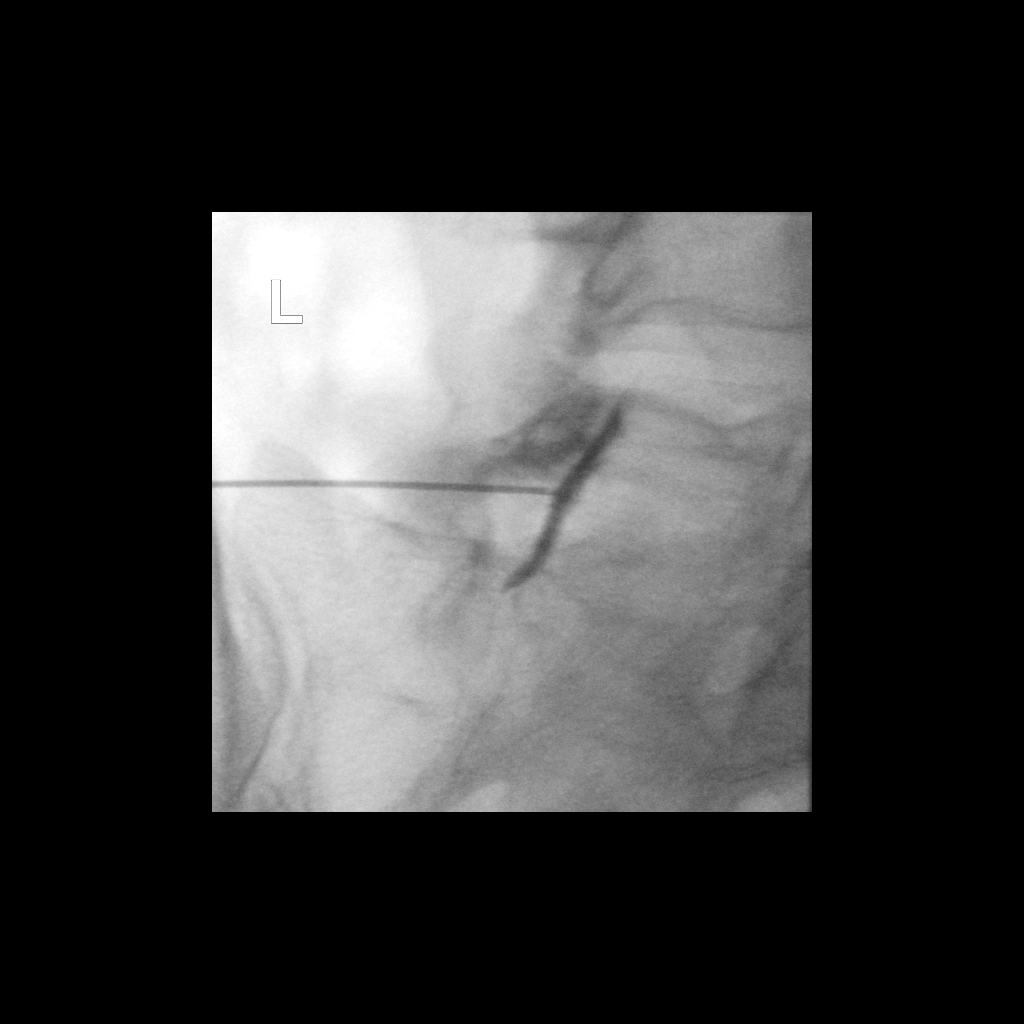

[Series 2: ortho standard · 1 of 1 slices shown (2 of 2)]
[im 1/1]
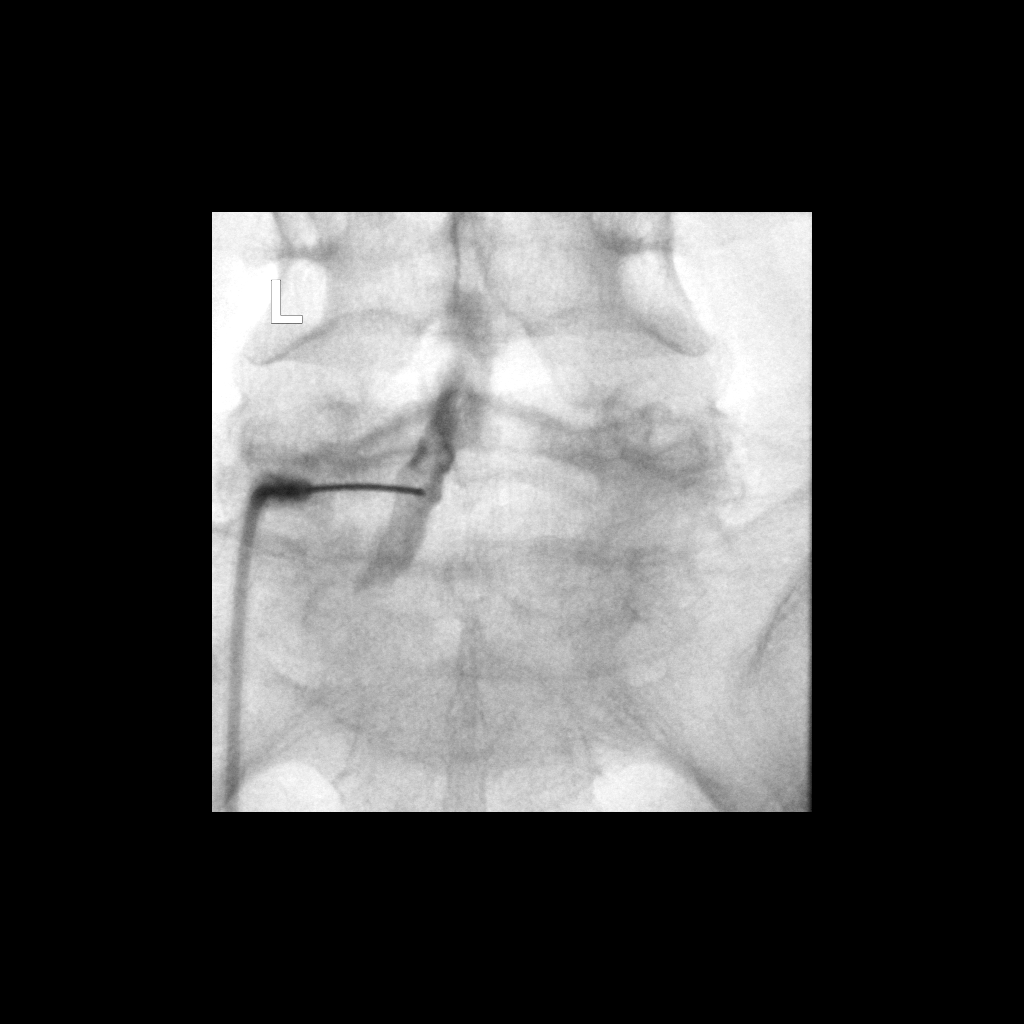

[2 of 2 positions shown; findings below may reference images not displayed]

FLUOROSCOPY TIME:  Radiation Exposure Index (as provided by the
fluoroscopic device): 12.56 ?Gy*m2

Fluoroscopy Time:  8 seconds.

Number of Acquired Images:  0

PROCEDURE:
The procedure, risks, benefits, and alternatives were explained to
the patient. Questions regarding the procedure were encouraged and
answered. The patient understands and consents to the procedure.

LUMBAR EPIDURAL INJECTION:

An interlaminar approach was performed on left at L5-S1. The
overlying skin was cleansed and anesthetized. A 3.5 inch 20 gauge
epidural needle was advanced using loss-of-resistance technique.

DIAGNOSTIC EPIDURAL INJECTION:

Injection of Isovue-M 200 shows a good epidural pattern with spread
above and below the level of needle placement, primarily on the
left. No vascular opacification is seen.

THERAPEUTIC EPIDURAL INJECTION:

120 mg of Depo-Medrol mixed with 3 mL of 1% lidocaine were
instilled. The procedure was well-tolerated, and the patient was
discharged thirty minutes following the injection in good condition.

COMPLICATIONS:
None immediate.
IMPRESSION: Technically successful epidural injection on the left at L5-S1 #1.

## 2020-04-25 ENCOUNTER — Other Ambulatory Visit: Payer: Self-pay | Admitting: Internal Medicine

## 2020-04-25 NOTE — Telephone Encounter (Signed)
Done erx 

## 2020-04-25 NOTE — Telephone Encounter (Signed)
Sent to Dr. John. 

## 2020-11-11 ENCOUNTER — Other Ambulatory Visit: Payer: Self-pay | Admitting: Internal Medicine

## 2020-11-11 NOTE — Telephone Encounter (Signed)
Xanax done erx  Ok to let pt know - LOV mar 2021  Please make rov for further reiflls

## 2021-02-05 ENCOUNTER — Telehealth (INDEPENDENT_AMBULATORY_CARE_PROVIDER_SITE_OTHER): Payer: Self-pay | Admitting: Family Medicine

## 2021-02-05 ENCOUNTER — Telehealth: Payer: Managed Care, Other (non HMO) | Admitting: Family

## 2021-02-05 ENCOUNTER — Telehealth: Payer: Self-pay | Admitting: Internal Medicine

## 2021-02-05 ENCOUNTER — Encounter: Payer: Self-pay | Admitting: Family Medicine

## 2021-02-05 ENCOUNTER — Telehealth: Payer: Managed Care, Other (non HMO) | Admitting: Physician Assistant

## 2021-02-05 VITALS — Temp 98.0°F

## 2021-02-05 DIAGNOSIS — Z20822 Contact with and (suspected) exposure to covid-19: Secondary | ICD-10-CM

## 2021-02-05 DIAGNOSIS — R110 Nausea: Secondary | ICD-10-CM

## 2021-02-05 DIAGNOSIS — R197 Diarrhea, unspecified: Secondary | ICD-10-CM

## 2021-02-05 DIAGNOSIS — R509 Fever, unspecified: Secondary | ICD-10-CM

## 2021-02-05 NOTE — Progress Notes (Signed)
Virtual Visit via Video Note  I connected with Zachary Riley  on 02/05/21 at 11:40 AM EDT by a video enabled telemedicine application and verified that I am speaking with the correct person using two identifiers.  Location patient: home, [place] Location provider:work or home office Persons participating in the virtual visit: patient, provider  I discussed the limitations of evaluation and management by telemedicine and the availability of in person appointments. The patient expressed understanding and agreed to proceed.   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 He was within 6 feet of them without masks.  -tested negative 3 times on a home test -Pertinent past medical history: see below -Pertinent medication allergies:  Allergies  Allergen Reactions   Esomeprazole Magnesium     REACTION: per patient   Penicillins     REACTION: GI upset  -COVID-19 vaccine status:vaccinated x 2 and had booster  ROS: See pertinent positives and negatives per HPI.  Past Medical History:  Diagnosis Date   ALLERGIC RHINITIS 02/14/2008   ANXIETY 02/14/2008   BACK PAIN 02/18/2009   COMMON MIGRAINE 02/18/2009   DEPRESSION 02/14/2008   GERD 02/14/2008   HEMATOCHEZIA 04/29/2010   HYPERLIPIDEMIA 02/14/2008   IBS 02/14/2008   INGUINAL HERNIA 02/14/2008   OTITIS MEDIA, ACUTE, LEFT 03/02/2010   SINUSITIS- ACUTE-NOS 11/05/2009   URI 08/27/2008    Past Surgical History:  Procedure Laterality Date   s/p [facility] 7/09       Current Outpatient Medications:    ALPRAZolam (XANAX) 1 MG tablet, Take 1 tablet by mouth three times daily as needed,  Disp: 90 tablet, Rfl: 0   Azelastine-Fluticasone 137-50 MCG/ACT SUSP, Place 1 spray into the nose in the morning and at bedtime., Disp: 23 g, Rfl: 5   fluticasone (FLONASE) 50 MCG/ACT nasal spray, Place into both nostrils as needed for allergies or rhinitis., Disp: , Rfl:   EXAM:  VITALS per patient if applicable:  GENERAL: alert, oriented, appears well and in no acute distress  HEENT: atraumatic, conjunttiva clear, no obvious abnormalities on inspection of external nose and ears  NECK: normal movements of the head and neck  LUNGS: on inspection no signs of respiratory distress, breathing rate appears normal, no obvious gross SOB, gasping or wheezing  CV: no obvious cyanosis  MS: moves all visible extremities without noticeable abnormality  PSYCH/NEURO: pleasant and cooperative, no obvious depression or anxiety, speech and thought processing grossly intact  ASSESSMENT AND PLAN:  Discussed the following assessment and plan:  Nausea  Diarrhea, unspecified type  Close exposure to COVID-19 virus  -we discussed possible serious and likely etiologies, options for evaluation and workup, limitations of telemedicine visit vs in person visit, treatment, treatment risks and precautions. Pt prefers to treat via telemedicine empirically rather than in person at this moment.  Query COVID-19 with false negative testing, viral illness versus other.  Given he is feeling better, he opted against any treatment at this time.  He does plan to do a PCR test or another home test on day 4 or 5 of his illness.  In the interim he will continue to stay home  until he is feeling better.  He did want a work note.  We did discuss COVID treatment options and treatment window in case he test positive.  He agrees to schedule follow-up video visit if he has a positive test and desires treatment or has any concerns. Work/School slipped offered: provided in patient instructions  Advised to seek prompt in person care if  worsening, new symptoms arise, or if is not improving with treatment. Discussed options for inperson care if PCP office not available. Did let this patient know that I only do telemedicine on Tuesdays and Thursdays for [place]. Advised to schedule follow up visit with PCP or [facility] if any further questions or concerns to avoid delays in care.   I discussed the assessment and treatment plan with the patient. The patient was provided an opportunity to ask questions and all were answered. The patient agreed with the plan and demonstrated an understanding of the instructions.     Terressa Koyanagi, DO

## 2021-02-05 NOTE — Patient Instructions (Signed)
   ---------------------------------------------------------------------------------------------------------------------------      WORK SLIP:  Patient Zachary Riley,  May 17, 1966, was seen for a medical visit today, 02/05/21 . Please excuse from work for a COVID like illness. We advise 10 days minimum from the onset of symptoms (02/02/21) PLUS 1 day of no fever and improved symptoms. Will defer to employer for a sooner return to work if symptoms have resolved, it is greater than 5 days since the positive test and the patient can wear a high-quality, tight fitting mask such as N95 or KN95 at all times for an additional 5 days. Would also suggest COVID19 antigen testing is negative prior to return.  Sincerely: E-signature: Dr. Kriste Basque, DO [facility] Ph: [phone]   ------------------------------------------------------------------------------------------------------------------------------    HOME CARE TIPS:  [name] COVID19 testing information: [email or web address] OR [phone] Most pharmacies also offer testing and home test kits. If the Covid19 test is positive, please make a prompt follow up visit with your primary care office or with [place] to discuss treatment options. Treatments for Covid19 are best given early in the course of the illness.   -can use tylenol or aleve if needed for fevers, aches and pains per instructions  -can use nasal saline a few times per day if you have nasal congestion; sometimes  a short course of Afrin nasal spray for 3 days can help with symptoms as well  -stay hydrated, drink plenty of fluids and eat small healthy meals - avoid dairy  -If the Covid test is positive, check out the [facility] website for more information on home care, transmission and treatment for COVID19  -follow up with your doctor in 2-3 days unless improving and feeling better  -stay home while sick, except  to seek medical care. If you have COVID19, ideally it would be best to stay home for a full 10 days since the onset of symptoms PLUS one day of no fever and feeling better. Wear a good mask that fits snugly (such as N95 or KN95) if around others to reduce the risk of transmission.  It was nice to meet you today, and I really hope you are feeling better soon. I help [name] out with telemedicine visits on Tuesdays and Thursdays and am available for visits on those days. If you have any concerns or questions following this visit please schedule a follow up visit with your Primary Care doctor or seek care at a local urgent care clinic to avoid delays in care.    Seek in person care or schedule a follow up video visit promptly if your symptoms worsen, new concerns arise or you are not improving with treatment. Call 911 and/or seek emergency care if your symptoms are severe or life threatening.

## 2021-02-05 NOTE — Progress Notes (Signed)
Based on what you shared with me, I feel your condition warrants further evaluation and I recommend that you be seen in a face to face office visit.  Given the diarrhea and fever you need to be seen today to rule out a more serious infection.    NOTE: If you entered your credit card information for this eVisit, you will not be charged. You may see a "hold" on your card for the $35 but that hold will drop off and you will not have a charge processed.   If you are having a true medical emergency please call 911.      For an urgent face to face visit, [facility] has six urgent care centers for your convenience:     [facility] at [facility] Directions [phone] [address] [place], [place] [address] 8 am - 4 pm Monday - Friday    [facility]) Get Driving Directions [phone] [address] [place], [place] [address] 8 am to 8 pm Monday-Friday 10 am to 6 pm [facility] Urgent Care Center [facility]) Get Driving Directions [phone]  [address] [place],  [place]  [address] 8 am to 8 pm Monday-Friday 8 am to 4 pm [facility] Urgent Care at [facility] Get Driving Directions [phone] [address] [place], [place] [address] 8 am to 8 pm Monday-Friday 8 am to 4 pm [facility] Urgent Care at [facility] Get Driving Directions  [phone] [address] [place], [place] [address] 8 am to 8 pm Monday-Friday 8 am to 4 pm [facility] Urgent Care at [facility] Directions [phone] [address] [place], [place] [address] 8 am to 8 pm Monday-Friday 8 am to 4 pm Saturday-Sunday     Your MyChart E-visit questionnaire answers were reviewed by a board certified advanced clinical practitioner to complete your personal care plan  based on your specific symptoms.  Thank you for using e-Visits.

## 2021-02-05 NOTE — Progress Notes (Signed)
We are sorry that you are not feeling well.  Here is how we plan to help!  Based on what you have shared with me it looks like you have Acute Infectious Diarrhea.  Most cases of acute diarrhea are due to infections with virus and bacteria and are self-limited conditions lasting less than 14 days.  For your symptoms you may take Imodium 2 mg tablets that are over the counter at your local pharmacy. Take two tablet now and then one after each loose stool up to 6 a day.  Antibiotics are not needed for most people with diarrhea.  Optional: I have recommended OTC Imodium for diarrhea  HOME CARE We recommend changing your diet to help with your symptoms for the next few days. Drink plenty of fluids that contain water salt and sugar. Sports drinks such as Gatorade may help.  You may try broths, soups, bananas, applesauce, soft breads, mashed potatoes or crackers.  You are considered infectious for as long as the diarrhea continues. Hand washing or use of alcohol based hand sanitizers is recommend. It is best to stay out of work or school until your symptoms stop.   GET HELP RIGHT AWAY If you have dark yellow colored urine or do not pass urine frequently you should drink more fluids.   If your symptoms worsen  If you feel like you are going to pass out (faint) You have a new problem  MAKE SURE YOU  Understand these instructions. Will watch your condition. Will get help right away if you are not doing well or get worse.  Your e-visit answers were reviewed by a board certified advanced clinical practitioner to complete your personal care plan.  Depending on the condition, your plan could have included both over the counter or prescription medications.  If there is a problem please reply  once you have received a response from your provider.  Your safety is important to [place].  If you have drug allergies check your prescription carefully.    You can use MyChart to ask questions about today's  visit, request a non-urgent call back, or ask for a work or school excuse for 24 hours related to this e-Visit. If it has been greater than 24 hours you will need to follow up with your provider, or enter a new e-Visit to address those concerns.   You will get an e-mail in the next two days asking about your experience.  I hope that your e-visit has been valuable and will speed your recovery. Thank you for using e-visits.  I provided 5 minutes of non face-to-face time during this encounter for chart review and documentation.

## 2021-02-05 NOTE — Telephone Encounter (Signed)
[facility] FYI  States he has had exposure to COVID via coworkers and tested negative. He developed low grade fever by touch, headache, cold, cough, and nausea symptoms about 3 days ago. He developed mild chest tightness (current tightness rated as a 1 on scale to 10) and SOB about 3 days ago. He has coughed up black coffee ground material about 3 times. No wheezing or stridor. Alert and responsive.  Advised to go to ED now. Caller understood but disagreed.  Dearis shared that he has coughed up black coffee ground material, has been having SOB and chest tightness. Declined the go to ED disposition and further health info. Tareq plans to call the office at 8am to get a note for work. Curties became very hostile, yelled profanity and then hung up before the call could be completely closed.

## 2021-02-05 NOTE — Progress Notes (Signed)
See my chart response .

## 2021-02-09 ENCOUNTER — Encounter: Payer: Self-pay | Admitting: Internal Medicine

## 2021-02-10 ENCOUNTER — Encounter: Payer: Self-pay | Admitting: Family Medicine

## 2021-03-23 ENCOUNTER — Other Ambulatory Visit: Payer: Self-pay | Admitting: Internal Medicine

## 2021-03-23 NOTE — Telephone Encounter (Signed)
Unable to refill xanax due to office refill policy  Pt last seen mar 2021  Needs ROV for further refills

## 2021-04-10 ENCOUNTER — Other Ambulatory Visit: Payer: Self-pay | Admitting: Internal Medicine

## 2021-04-10 NOTE — Telephone Encounter (Signed)
Ok to contact pt  Unable to futther refill xanax  Needs rov for further refills

## 2021-04-23 ENCOUNTER — Encounter: Payer: Self-pay | Admitting: Internal Medicine

## 2021-08-27 ENCOUNTER — Encounter: Payer: Self-pay | Admitting: Internal Medicine

## 2021-08-28 ENCOUNTER — Telehealth: Payer: Self-pay | Admitting: Physician Assistant

## 2021-08-28 DIAGNOSIS — R6889 Other general symptoms and signs: Secondary | ICD-10-CM

## 2021-08-28 MED ORDER — IPRATROPIUM BROMIDE 0.03 % NA SOLN: 2.0000 | Freq: Two times a day (BID) | NASAL | 0 refills | Status: DC

## 2021-08-28 MED ORDER — ONDANSETRON HCL 4 MG PO TABS: 4.0000 mg | ORAL_TABLET | Freq: Three times a day (TID) | ORAL | 0 refills | Status: DC | PRN

## 2021-08-28 MED ORDER — PSEUDOEPH-BROMPHEN-DM 30-2-10 MG/5ML PO SYRP: 5.0000 mL | ORAL_SOLUTION | Freq: Four times a day (QID) | ORAL | 0 refills | Status: DC | PRN

## 2021-08-28 NOTE — Progress Notes (Signed)
E visit for Flu like symptoms   We are sorry that you are not feeling well.  Here is how we plan to help! Based on what you have shared with me it looks like you may have flu-like symptoms that should be watched but do not seem to indicate anti-viral treatment.  Influenza or the flu is   an infection caused by a respiratory virus. The flu virus is highly contagious and persons who did not receive their yearly flu vaccination may catch the flu from close contact.  We have anti-viral medications to treat the viruses that cause this infection. They are not a cure and only shorten the course of the infection. These prescriptions are most effective when they are given within the first 2 days of flu symptoms. Antiviral medication are indicated if you have a high risk of complications from the flu. You should  also consider an antiviral medication if you are in close contact with someone who is at risk. These medications can help patients avoid complications from the flu  but have side effects that you should know. Possible side effects from Tamiflu or oseltamivir include nausea, vomiting, diarrhea, dizziness, headaches, eye redness, sleep problems or other respiratory symptoms. You should not take Tamiflu if you have an allergy to oseltamivir or any to the ingredients in Tamiflu.  Based upon your symptoms and potential risk factors I recommend that you follow the flu symptoms recommendation that I have listed below.  I will prescribe Bromfed DM cough syrup, zofran for nausea, and ipratropium for nasal congestion and drainage.   ANYONE WHO HAS FLU SYMPTOMS SHOULD: Stay home. The flu is highly contagious and going out or to work exposes others! Be sure to drink plenty of fluids. Water is fine as well as fruit juices, sodas and electrolyte beverages. You may want to stay away from caffeine or alcohol. If you are nauseated, try taking small sips of liquids. How do you know if you are getting enough  fluid? Your urine should be a pale yellow or almost colorless. Get rest. Taking a steamy shower or using a humidifier may help nasal congestion and ease sore throat pain. Using a saline nasal spray works much the same way. Cough drops, hard candies and sore throat lozenges may ease your cough. Line up a caregiver. Have someone check on you regularly.   GET HELP RIGHT AWAY IF: You cannot keep down liquids or your medications. You become short of breath Your fell like you are going to pass out or loose consciousness. Your symptoms persist after you have completed your treatment plan MAKE SURE YOU  Understand these instructions. Will watch your condition. Will get help right away if you are not doing well or get worse.  Your e-visit answers were reviewed by a board certified advanced clinical practitioner to complete your personal care plan.  Depending on the condition, your plan could have included both over the counter or prescription medications.  If there is a problem please reply  once you have received a response from your provider.  Your safety is important to [place].  If you have drug allergies check your prescription carefully.    You can use MyChart to ask questions about todays visit, request a non-urgent call back, or ask for a work or school excuse for 24 hours related to this e-Visit. If it has been greater than 24 hours you will need to follow up with your provider, or enter a new e-Visit to address those concerns.  You will get an e-mail in the next two days asking about your experience.  I hope that your e-visit has been valuable and will speed your recovery. Thank you for using e-visits.  I provided 5 minutes of non face-to-face time during this encounter for chart review and documentation.

## 2021-09-15 ENCOUNTER — Telehealth: Payer: Self-pay | Admitting: Physician Assistant

## 2021-09-15 DIAGNOSIS — J208 Acute bronchitis due to other specified organisms: Secondary | ICD-10-CM

## 2021-09-15 MED ORDER — PREDNISONE 10 MG (21) PO TBPK: ORAL_TABLET | ORAL | 0 refills | Status: DC

## 2021-09-15 MED ORDER — BENZONATATE 100 MG PO CAPS: 100.0000 mg | ORAL_CAPSULE | Freq: Three times a day (TID) | ORAL | 0 refills | Status: DC | PRN

## 2021-09-15 NOTE — Progress Notes (Signed)
We are sorry that you are not feeling well.  Here is how we plan to help! ° °Based on your presentation I believe you most likely have A cough due to a virus.  This is called viral bronchitis and is best treated by rest, plenty of fluids and control of the cough.  You may use Ibuprofen or Tylenol as directed to help your symptoms.   °  °In addition you may use A prescription cough medication called Tessalon Perles 100mg. You may take 1-2 capsules every 8 hours as needed for your cough. ° °Prednisone 10 mg daily for 6 days (see taper instructions below) ° °Directions for 6 day taper: °Day 1: 2 tablets before breakfast, 1 after both lunch & dinner and 2 at bedtime °Day 2: 1 tab before breakfast, 1 after both lunch & dinner and 2 at bedtime °Day 3: 1 tab at each meal & 1 at bedtime °Day 4: 1 tab at breakfast, 1 at lunch, 1 at bedtime °Day 5: 1 tab at breakfast & 1 tab at bedtime °Day 6: 1 tab at breakfast ° °From your responses in the eVisit questionnaire you describe inflammation in the upper respiratory tract which is causing a significant cough.  This is commonly called Bronchitis and has four common causes:   °Allergies °Viral Infections °Acid Reflux °Bacterial Infection °Allergies, viruses and acid reflux are treated by controlling symptoms or eliminating the cause. An example might be a cough caused by taking certain blood pressure medications. You stop the cough by changing the medication. Another example might be a cough caused by acid reflux. Controlling the reflux helps control the cough. ° °USE OF BRONCHODILATOR ("RESCUE") INHALERS: °There is a risk from using your bronchodilator too frequently.  The risk is that over-reliance on a medication which only relaxes the muscles surrounding the breathing tubes can reduce the effectiveness of medications prescribed to reduce swelling and congestion of the tubes themselves.  Although you feel brief relief from the bronchodilator inhaler, your asthma may actually be  worsening with the tubes becoming more swollen and filled with mucus.  This can delay other crucial treatments, such as oral steroid medications. If you need to use a bronchodilator inhaler daily, several times per day, you should discuss this with your provider.  There are probably better treatments that could be used to keep your asthma under control.  °   °HOME CARE °Only take medications as instructed by your medical team. °Complete the entire course of an antibiotic. °Drink plenty of fluids and get plenty of rest. °Avoid close contacts especially the very young and the elderly °Cover your mouth if you cough or cough into your sleeve. °Always remember to wash your hands °A steam or ultrasonic humidifier can help congestion.  ° °GET HELP RIGHT AWAY IF: °You develop worsening fever. °You become short of breath °You cough up blood. °Your symptoms persist after you have completed your treatment plan °MAKE SURE YOU  °Understand these instructions. °Will watch your condition. °Will get help right away if you are not doing well or get worse. °  ° °Thank you for choosing an e-visit. ° °Your e-visit answers were reviewed by a board certified advanced clinical practitioner to complete your personal care plan. Depending upon the condition, your plan could have included both over the counter or prescription medications. ° °Please review your pharmacy choice. Make sure the pharmacy is open so you can pick up prescription now. If there is a problem, you may contact your provider   through MyChart messaging and have the prescription routed to another pharmacy.  Your safety is important to us. If you have drug allergies check your prescription carefully.  ° °For the next 24 hours you can use MyChart to ask questions about today's visit, request a non-urgent call back, or ask for a work or school excuse. °You will get an email in the next two days asking about your experience. I hope that your e-visit has been valuable and will  speed your recovery. ° °I provided 5 minutes of non face-to-face time during this encounter for chart review and documentation.  ° °

## 2021-09-22 ENCOUNTER — Telehealth: Payer: Self-pay | Admitting: Physician Assistant

## 2021-09-22 DIAGNOSIS — B9689 Other specified bacterial agents as the cause of diseases classified elsewhere: Secondary | ICD-10-CM

## 2021-09-22 DIAGNOSIS — J208 Acute bronchitis due to other specified organisms: Secondary | ICD-10-CM

## 2021-09-22 MED ORDER — AZITHROMYCIN 250 MG PO TABS: ORAL_TABLET | ORAL | 0 refills | Status: AC

## 2021-09-22 MED ORDER — BENZONATATE 100 MG PO CAPS: 100.0000 mg | ORAL_CAPSULE | Freq: Three times a day (TID) | ORAL | 0 refills | Status: DC | PRN

## 2021-09-22 NOTE — Progress Notes (Signed)
I have spent 5 minutes in review of e-visit questionnaire, review and updating patient chart, medical decision making and response to patient.   Zachary Riley Zachary Randy Castrejon, PA-C    

## 2021-09-22 NOTE — Progress Notes (Signed)

## 2021-09-23 NOTE — Progress Notes (Signed)
E-Visit for Nausea and Vomiting   We are sorry that you are not feeling well. Here is how we plan to help!  Based on what you have shared with me it looks like you have a Virus that is irritating your GI tract.  Vomiting is the forceful emptying of a portion of the stomach's content through the mouth.  Although nausea and vomiting can make you feel miserable, it's important to remember that these are not diseases, but rather symptoms of an underlying illness.  When we treat short term symptoms, we always caution that any symptoms that persist should be fully evaluated in a medical office.  I have prescribed a medication that will help alleviate your symptoms and allow you to stay hydrated:  Zofran 4 mg 1 tablet every 8 hours as needed for nausea and vomiting  HOME CARE: Drink clear liquids.  This is very important! Dehydration (the lack of fluid) can lead to a serious complication.  Start off with 1 tablespoon every 5 minutes for 8 hours. You may begin eating bland foods after 8 hours without vomiting.  Start with saltine crackers, white bread, rice, mashed potatoes, applesauce. After 48 hours on a bland diet, you may resume a normal diet. Try to go to sleep.  Sleep often empties the stomach and relieves the need to vomit.  GET HELP RIGHT AWAY IF:  Your symptoms do not improve or worsen within 2 days after treatment. You have a fever for over 3 days. You cannot keep down fluids after trying the medication.  MAKE SURE YOU:  Understand these instructions. Will watch your condition. Will get help right away if you are not doing well or get worse.    Thank you for choosing an e-visit.  Your e-visit answers were reviewed by a board certified advanced clinical practitioner to complete your personal care plan. Depending upon the condition, your plan could have included both over the counter or prescription medications.  Please review your pharmacy choice. Make sure the pharmacy is open so  you can pick up prescription now. If there is a problem, you may contact your provider through MyChart messaging and have the prescription routed to another pharmacy.  Your safety is important to us. If you have drug allergies check your prescription carefully.   For the next 24 hours you can use MyChart to ask questions about today's visit, request a non-urgent call back, or ask for a work or school excuse. You will get an email in the next two days asking about your experience. I hope that your e-visit has been valuable and will speed your recovery.  5-10 minutes spent reviewing and documenting in chart.  

## 2021-09-24 MED ORDER — ONDANSETRON 4 MG PO TBDP: 4.0000 mg | ORAL_TABLET | Freq: Three times a day (TID) | ORAL | 0 refills | Status: DC | PRN

## 2021-09-24 NOTE — Addendum Note (Signed)
Addended by: Waldon Merl on: 09/24/2021 09:41 AM   Modules accepted: Orders

## 2023-02-16 ENCOUNTER — Telehealth: Payer: Self-pay | Admitting: Physician Assistant

## 2023-02-16 DIAGNOSIS — J069 Acute upper respiratory infection, unspecified: Secondary | ICD-10-CM

## 2023-02-16 MED ORDER — ALBUTEROL SULFATE HFA 108 (90 BASE) MCG/ACT IN AERS
1.0000 | INHALATION_SPRAY | Freq: Four times a day (QID) | RESPIRATORY_TRACT | 0 refills | Status: DC | PRN
Start: 2023-02-16 — End: 2023-09-07

## 2023-02-16 MED ORDER — FLUTICASONE PROPIONATE 50 MCG/ACT NA SUSP
2.0000 | Freq: Every day | NASAL | 0 refills | Status: DC
Start: 2023-02-16 — End: 2023-09-07

## 2023-02-16 MED ORDER — PSEUDOEPH-BROMPHEN-DM 30-2-10 MG/5ML PO SYRP: 5.0000 mL | ORAL_SOLUTION | Freq: Four times a day (QID) | ORAL | 0 refills | Status: DC | PRN

## 2023-02-16 NOTE — Progress Notes (Signed)
E visit for Flu like symptoms   We are sorry that you are not feeling well.  Here is how we plan to help! Based on what you have shared with me it looks like you may have flu-like symptoms that should be watched but do not seem to indicate anti-viral treatment.  Influenza or "the flu" is   an infection caused by a respiratory virus. The flu virus is highly contagious and persons who did not receive their yearly flu vaccination may "catch" the flu from close contact.  We have anti-viral medications to treat the viruses that cause this infection. They are not a "cure" and only shorten the course of the infection. These prescriptions are most effective when they are given within the first 2 days of "flu" symptoms. Antiviral medication are indicated if you have a high risk of complications from the flu. You should  also consider an antiviral medication if you are in close contact with someone who is at risk. These medications can help patients avoid complications from the flu  but have side effects that you should know. Possible side effects from Tamiflu or oseltamivir include nausea, vomiting, diarrhea, dizziness, headaches, eye redness, sleep problems or other respiratory symptoms. You should not take Tamiflu if you have an allergy to oseltamivir or any to the ingredients in Tamiflu.  Based upon your symptoms and potential risk factors I recommend that you follow the flu symptoms recommendation that I have listed below.  This is an infection that is most likely caused by a virus. There are no specific treatments other than to help you with the symptoms until the infection runs its course.  We are sorry you are not feeling well.  Here is how we plan to help!  For nasal congestion, you may use an oral decongestants such as Mucinex D or if you have glaucoma or high blood pressure use plain Mucinex.  Saline nasal spray or nasal drops can help and can safely be used as often as needed for congestion.  For  your congestion, I have prescribed Fluticasone nasal spray one spray in each nostril twice a day  If you do not have a history of heart disease, hypertension, diabetes or thyroid disease, prostate/bladder issues or glaucoma, you may also use Sudafed to treat nasal congestion.  It is highly recommended that you consult with a pharmacist or your primary care physician to ensure this medication is safe for you to take.     If you have a cough, you may use cough suppressants such as Delsym and Robitussin.  If you have glaucoma or high blood pressure, you can also use Coricidin HBP.   For cough I have prescribed for you Bromfed DM cough syrup Take 5mL every 6-8 hours as needed for cough.   I have also prescribed an Albuterol inhaler for shortness of breath and/or wheezing.   If you have a sore or scratchy throat, use a saltwater gargle-  to  teaspoon of salt dissolved in a 4-ounce to 8-ounce glass of warm water.  Gargle the solution for approximately 15-30 seconds and then spit.  It is important not to swallow the solution.  You can also use throat lozenges/cough drops and Chloraseptic spray to help with throat pain or discomfort.  Warm or cold liquids can also be helpful in relieving throat pain.  For headache, pain or general discomfort, you can use Ibuprofen or Tylenol as directed.   Some authorities believe that zinc sprays or the use of Echinacea  may shorten the course of your symptoms.   ANYONE WHO HAS FLU SYMPTOMS SHOULD: Stay home. The flu is highly contagious and going out or to work exposes others! Be sure to drink plenty of fluids. Water is fine as well as fruit juices, sodas and electrolyte beverages. You may want to stay away from caffeine or alcohol. If you are nauseated, try taking small sips of liquids. How do you know if you are getting enough fluid? Your urine should be a pale yellow or almost colorless. Get rest. Taking a steamy shower or using a humidifier may help nasal  congestion and ease sore throat pain. Using a saline nasal spray works much the same way. Cough drops, hard candies and sore throat lozenges may ease your cough. Line up a caregiver. Have someone check on you regularly.   GET HELP RIGHT AWAY IF: You cannot keep down liquids or your medications. You become short of breath Your fell like you are going to pass out or loose consciousness. Your symptoms persist after you have completed your treatment plan MAKE SURE YOU  Understand these instructions. Will watch your condition. Will get help right away if you are not doing well or get worse.  Your e-visit answers were reviewed by a board certified advanced clinical practitioner to complete your personal care plan.  Depending on the condition, your plan could have included both over the counter or prescription medications.  If there is a problem please reply  once you have received a response from your provider.  Your safety is important to [place].  If you have drug allergies check your prescription carefully.    You can use MyChart to ask questions about today's visit, request a non-urgent call back, or ask for a work or school excuse for 24 hours related to this e-Visit. If it has been greater than 24 hours you will need to follow up with your provider, or enter a new e-Visit to address those concerns.  You will get an e-mail in the next two days asking about your experience.  I hope that your e-visit has been valuable and will speed your recovery. Thank you for using e-visits.  I have spent 5 minutes in review of e-visit questionnaire, review and updating patient chart, medical decision making and response to patient.   Margaretann Loveless, PA-C

## 2023-09-07 ENCOUNTER — Telehealth: Payer: Self-pay | Admitting: Physician Assistant

## 2023-09-07 DIAGNOSIS — R6889 Other general symptoms and signs: Secondary | ICD-10-CM

## 2023-09-07 MED ORDER — BENZONATATE 100 MG PO CAPS
100.0000 mg | ORAL_CAPSULE | Freq: Three times a day (TID) | ORAL | 0 refills | Status: DC | PRN
Start: 2023-09-07 — End: 2023-11-14

## 2023-09-07 MED ORDER — OSELTAMIVIR PHOSPHATE 75 MG PO CAPS
75.0000 mg | ORAL_CAPSULE | Freq: Two times a day (BID) | ORAL | 0 refills | Status: DC
Start: 2023-09-07 — End: 2023-11-14

## 2023-09-07 NOTE — Patient Instructions (Signed)
 Zachary Riley, thank you for joining Hyla Maillard, PA-C for today's virtual visit.  While this provider is not your primary care provider (PCP), if your PCP is located in our provider database this encounter information will be shared with them immediately following your visit.   A [facility] MyChart account gives you access to today's visit and all your visits, tests, and labs performed at [facility] " click here if you don't have a [facility] MyChart account or go to [email or web address]  Consent: (Patient) Zachary Riley provided verbal consent for this virtual visit at the beginning of the encounter.  Current Medications:  Current Outpatient Medications:    albuterol  (VENTOLIN  HFA) 108 (90 Base) MCG/ACT inhaler, Inhale 1-2 puffs into the lungs every 6 (six) hours as needed., Disp: 8 g, Rfl: 0   ALPRAZolam  (XANAX ) 1 MG tablet, Take 1 tablet by mouth three times daily as needed, Disp: 90 tablet, Rfl: 0   brompheniramine-pseudoephedrine-DM 30-2-10 MG/5ML syrup, Take 5 mLs by mouth 4 (four) times daily as needed., Disp: 120 mL, Rfl: 0   fluticasone  (FLONASE ) 50 MCG/ACT nasal spray, Place 2 sprays into both nostrils daily., Disp: 16 g, Rfl: 0   Medications ordered in this encounter:  No orders of the defined types were placed in this encounter.    *If you need refills on other medications prior to your next appointment, please contact your pharmacy*  Follow-Up: Call back or seek an in-person evaluation if the symptoms worsen or if the condition fails to improve as anticipated.  [facility] [phone]  Other Instructions Please keep well-hydrated and try to get plenty of rest. If you have a humidifier, place it in the bedroom and run it at night. Start a saline nasal rinse for nasal congestion. You can consider use of a nasal steroid spray like Flonase  or Nasacort OTC. You can alternate between Tylenol  and Ibuprofen if needed for fever,  body aches, headache and/or throat pain. Salt water-gargles and chloraseptic spray can be very beneficial for sore throat. Mucinex-DM for congestion or cough. Please take all prescribed medications as directed.  Remain out of work until [employer] for 24 hours without a fever-reducing medication, and you are feeling better.  You should mask until symptoms are resolved.  If anything worsens despite treatment, you need to be evaluated in-person. Please do not delay care.  Influenza, Adult Influenza is also called "the flu." It is an infection in the lungs, nose, and throat (respiratory tract). It spreads easily from person to person (is contagious). The flu causes symptoms that are like a cold, along with high fever and body aches. What are the causes? This condition is caused by the influenza virus. You can get the virus by: Breathing in droplets that are in the air after a person infected with the flu coughed or sneezed. Touching something that has the virus on it and then touching your mouth, nose, or eyes. What increases the risk? Certain things may make you more likely to get the flu. These include: Not washing your hands often. Having close contact with many people during cold and flu season. Touching your mouth, eyes, or nose without first washing your hands. Not getting a flu shot every year. You may have a higher risk for the flu, and serious problems, such as a lung infection (pneumonia), if you: Are older than 65. Are pregnant. Have a weakened disease-fighting system (immune system) because of a disease or because you are taking certain  medicines. Have a long-term (chronic) condition, such as: Heart, kidney, or lung disease. Diabetes. Asthma. Have a liver disorder. Are very overweight (morbidly obese). Have anemia. What are the signs or symptoms? Symptoms usually begin suddenly and last 4-14 days. They may include: Fever and chills. Headaches, body aches, or muscle  aches. Sore throat. Cough. Runny or stuffy (congested) nose. Feeling discomfort in your chest. Not wanting to eat as much as normal. Feeling weak or tired. Feeling dizzy. Feeling sick to your stomach or throwing up. How is this treated? If the flu is found early, you can be treated with antiviral medicine. This can help to reduce how bad the illness is and how long it lasts. This may be given by mouth or through an IV tube. Taking care of yourself at home can help your symptoms get better. Your doctor may want you to: Take over-the-counter medicines. Drink plenty of fluids. The flu often goes away on its own. If you have very bad symptoms or other problems, you may be treated in a hospital. Follow these instructions at home:     Activity Rest as needed. Get plenty of sleep. Stay home from work or school as told by your doctor. Do not leave home until you do not have a fever for 24 hours without taking medicine. Leave home only to go to your doctor. Eating and drinking Take an ORS (oral rehydration solution). This is a drink that is sold at pharmacies and stores. Drink enough fluid to keep your pee pale yellow. Drink clear fluids in small amounts as you are able. Clear fluids include: Water. Ice chips. Fruit juice mixed with water. Low-calorie sports drinks. Eat bland foods that are easy to digest. Eat small amounts as you are able. These foods include: Bananas. Applesauce. Rice. Lean meats. Toast. Crackers. Do not eat or drink: Fluids that have a lot of sugar or caffeine. Alcohol. Spicy or fatty foods. General instructions Take over-the-counter and prescription medicines only as told by your doctor. Use a cool mist humidifier to add moisture to the air in your home. This can make it easier for you to breathe. When using a cool mist humidifier, clean it daily. Empty water and replace with clean water. Cover your mouth and nose when you cough or sneeze. Wash your hands  with soap and water often and for at least 20 seconds. This is also important after you cough or sneeze. If you cannot use soap and water, use alcohol-based hand sanitizer. Keep all follow-up visits. How is this prevented?  Get a flu shot every year. You may get the flu shot in late summer, fall, or winter. Ask your doctor when you should get your flu shot. Avoid contact with people who are sick during fall and winter. This is cold and flu season. Contact a doctor if: You get new symptoms. You have: Chest pain. Watery poop (diarrhea). A fever. Your cough gets worse. You start to have more mucus. You feel sick to your stomach. You throw up. Get help right away if you: Have shortness of breath. Have trouble breathing. Have skin or nails that turn a bluish color. Have very bad pain or stiffness in your neck. Get a sudden headache. Get sudden pain in your face or ear. Cannot eat or drink without throwing up. These symptoms may represent a serious problem that is an emergency. Get medical help right away. Call your local emergency services (911 in the [place] Do not wait to see if the symptoms  will go away. Do not drive yourself to the hospital. Summary Influenza is also called "the flu." It is an infection in the lungs, nose, and throat. It spreads easily from person to person. Take over-the-counter and prescription medicines only as told by your doctor. Getting a flu shot every year is the best way to not get the flu. This information is not intended to replace advice given to you by your health care provider. Make sure you discuss any questions you have with your health care provider. Document Revised: 03/28/2020 Document Reviewed: 03/28/2020 Elsevier Patient Education  2023 [employer]      If you have been instructed to have an in-person evaluation today at a local Urgent Care facility, please use the link below. It will take you to a list of all of our available [facility], including address, phone number and hours of operation. Please do not delay care.  [facility]  If you or a family member do not have a primary care provider, use the link below to schedule a visit and establish care. When you choose a [facility] primary care physician or advanced practice provider, you gain a long-term partner in health. Find a Primary Care Provider  Learn more about [facility]'s in-office and virtual care options: [facility] - Get Care Now

## 2023-09-07 NOTE — Progress Notes (Signed)
 Virtual Visit Consent   Zachary Riley, you are scheduled for a virtual visit with a [facility] provider today. Just as with appointments in the office, your consent must be obtained to participate. Your consent will be active for this visit and any virtual visit you may have with one of our providers in the next 365 days. If you have a MyChart account, a copy of this consent can be sent to you electronically.  As this is a virtual visit, video technology does not allow for your provider to perform a traditional examination. This may limit your provider's ability to fully assess your condition. If your provider identifies any concerns that need to be evaluated in person or the need to arrange testing (such as labs, EKG, etc.), we will make arrangements to do so. Although advances in technology are sophisticated, we cannot ensure that it will always work on either your end or our end. If the connection with a video visit is poor, the visit may have to be switched to a telephone visit. With either a video or telephone visit, we are not always able to ensure that we have a secure connection.  By engaging in this virtual visit, you consent to the provision of healthcare and authorize for your insurance to be billed (if applicable) for the services provided during this visit. Depending on your insurance coverage, you may receive a charge related to this service.  I need to obtain your verbal consent now. Are you willing to proceed with your visit today? Zachary Riley has provided verbal consent on 09/07/2023 for a virtual visit (video or telephone). Hyla Maillard, [place]  Date: 09/07/2023 8:59 AM  Virtual Visit via Video Note   I, Hyla Maillard, connected with  Zachary Riley  ([id number], [birth year]) on 09/07/23 at  9:00 AM EST by a video-enabled telemedicine application and verified that I am speaking with the correct person using two identifiers.  Location: Patient: Virtual Visit  Location Patient: Home Provider: Virtual Visit Location Provider: Home Office   I discussed the limitations of evaluation and management by telemedicine and the availability of in person appointments. The patient expressed understanding and agreed to proceed.    History of Present Illness: Zachary Riley is a 58 y.o. who identifies as a male who was assigned male at birth, and is being seen today for URI symptoms starting 2 days ago with headache, body aches loose stool, nasal and sinus congestion, fatigue, anorexia, cough that was dry but now productive. Notes SOB occasionally outside of a coughing spell. Now with low-grade fever.  Took a home home COVID test which was negative.  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  OTC -- Tylenol  for headache, Alka Seltzer Cold and Sinus for URI symptoms.   HPI: HPI  Problems:  Patient Active Problem List   Diagnosis Date Noted   History of frequent upper respiratory infection 11/12/2019   Recurrent sinusitis 11/12/2019   History of smoking 11/12/2019   Heartburn 11/12/2019   Febrile illness 10/28/2019   Wheezing 08/25/2018   Lower back pain 06/26/2015   Hyperglycemia 06/26/2015   Onychomycosis 06/07/2014   Carpal tunnel syndrome 07/03/2013   Encounter for well adult exam with abnormal findings 07/17/2012   HEMATOCHEZIA 04/29/2010   Migraine without aura 02/18/2009   Backache 02/18/2009   Hyperlipidemia 02/14/2008   Anxiety state 02/14/2008   Depression 02/14/2008   Other allergic rhinitis 02/14/2008  GERD 02/14/2008   Inguinal hernia 02/14/2008   IBS 02/14/2008    Allergies:  Allergies  Allergen Reactions   Esomeprazole Magnesium     REACTION: per patient   Penicillins     REACTION: GI upset   Medications:  Current Outpatient Medications:    benzonatate  (TESSALON ) 100 MG capsule, Take 1 capsule (100 mg total) by mouth 3 (three) times daily as needed for cough., Disp: 30 capsule, Rfl:  0   oseltamivir  (TAMIFLU ) 75 MG capsule, Take 1 capsule (75 mg total) by mouth 2 (two) times daily., Disp: 10 capsule, Rfl: 0   ALPRAZolam  (XANAX ) 1 MG tablet, Take 1 tablet by mouth three times daily as needed, Disp: 90 tablet, Rfl: 0  Observations/Objective: Patient is well-developed, well-nourished in no acute distress.  Resting comfortably at home.  Head is normocephalic, atraumatic.  No labored breathing. Speech is clear and coherent with logical content.  Patient is alert and oriented at baseline.   Assessment and Plan: 1. Flu-like symptoms (Primary) - oseltamivir  (TAMIFLU ) 75 MG capsule; Take 1 capsule (75 mg total) by mouth 2 (two) times daily.  Dispense: 10 capsule; Refill: 0 - benzonatate  (TESSALON ) 100 MG capsule; Take 1 capsule (100 mg total) by mouth 3 (three) times daily as needed for cough.  Dispense: 30 capsule; Refill: 0  Negative COVID. Classic influenza symptoms. Unknown exposure. Supportive measures, OTC medications and Vitamin recommendations reviewed. Will start Tamiflu  per orders. Tessalon  per orders. Resume Albuterol . Quarantine reviewed with patient.    Follow Up Instructions: I discussed the assessment and treatment plan with the patient. The patient was provided an opportunity to ask questions and all were answered. The patient agreed with the plan and demonstrated an understanding of the instructions.  A copy of instructions were sent to the patient via MyChart unless otherwise noted below.   The patient was advised to call back or seek an in-person evaluation if the symptoms worsen or if the condition fails to improve as anticipated.    Hyla Maillard, PA-C

## 2023-09-09 ENCOUNTER — Encounter: Payer: Self-pay | Admitting: Internal Medicine

## 2023-11-14 ENCOUNTER — Encounter: Payer: Self-pay | Admitting: Physician Assistant

## 2023-11-14 ENCOUNTER — Telehealth: Admitting: Physician Assistant

## 2023-11-14 DIAGNOSIS — B349 Viral infection, unspecified: Secondary | ICD-10-CM | POA: Diagnosis not present

## 2023-11-14 MED ORDER — ONDANSETRON 4 MG PO TBDP
4.0000 mg | ORAL_TABLET | Freq: Three times a day (TID) | ORAL | 0 refills | Status: DC | PRN
Start: 2023-11-14 — End: 2024-03-08

## 2023-11-14 NOTE — Progress Notes (Signed)
 Virtual Visit Consent   Zachary Riley, you are scheduled for a virtual visit with a [facility] provider today. Just as with appointments in the office, your consent must be obtained to participate. Your consent will be active for this visit and any virtual visit you may have with one of our providers in the next 365 days. If you have a MyChart account, a copy of this consent can be sent to you electronically.  As this is a virtual visit, video technology does not allow for your provider to perform a traditional examination. This may limit your provider's ability to fully assess your condition. If your provider identifies any concerns that need to be evaluated in person or the need to arrange testing (such as labs, EKG, etc.), we will make arrangements to do so. Although advances in technology are sophisticated, we cannot ensure that it will always work on either your end or our end. If the connection with a video visit is poor, the visit may have to be switched to a telephone visit. With either a video or telephone visit, we are not always able to ensure that we have a secure connection.  By engaging in this virtual visit, you consent to the provision of healthcare and authorize for your insurance to be billed (if applicable) for the services provided during this visit. Depending on your insurance coverage, you may receive a charge related to this service.  I need to obtain your verbal consent now. Are you willing to proceed with your visit today? ABRAR BILTON has provided verbal consent on 11/14/2023 for a virtual visit (video or telephone). Zachary Riley, [place]  Date: 11/14/2023 2:57 PM   Virtual Visit via Video Note   I, Zachary Riley, connected with  Zachary Riley  ([id number], Nov 02, 1965) on 11/14/23 at  3:00 PM EDT by a video-enabled telemedicine application and verified that I am speaking with the correct person using two identifiers.  Location: Patient: Virtual Visit  Location Patient: Home Provider: Virtual Visit Location Provider: Home Office   I discussed the limitations of evaluation and management by telemedicine and the availability of in person appointments. The patient expressed understanding and agreed to proceed.    History of Present Illness: Zachary Riley is a 58 y.o. who identifies as a male who was assigned male at birth, and is being seen today for onset over night of nausea, sweats, fatigue, diarrhea (non-bloody). Notes woke him from sleep. Got up and used the restroom, then took OTC antidiarrheals. This helped slow it down but still feeling under the weather. Now with an associated headache, mild cough, sinus congestion and headache. Notes co-worker with similar illness, but not sure what he has.    HPI: HPI  Problems:  Patient Active Problem List   Diagnosis Date Noted   History of frequent upper respiratory infection 11/12/2019   Recurrent sinusitis 11/12/2019   History of smoking 11/12/2019   Heartburn 11/12/2019   Febrile illness 10/28/2019   Wheezing 08/25/2018   Lower back pain 06/26/2015   Hyperglycemia 06/26/2015   Onychomycosis 06/07/2014   Carpal tunnel syndrome 07/03/2013   Encounter for well adult exam with abnormal findings 07/17/2012   HEMATOCHEZIA 04/29/2010   Migraine without aura 02/18/2009   Backache 02/18/2009   Hyperlipidemia 02/14/2008   Anxiety state 02/14/2008   Depression 02/14/2008   Other allergic rhinitis 02/14/2008   GERD 02/14/2008   Inguinal hernia 02/14/2008   IBS 02/14/2008    Allergies:  Allergies  Allergen Reactions   Esomeprazole Magnesium     REACTION: per patient   Penicillins     REACTION: GI upset   Medications:  Current Outpatient Medications:    ondansetron (ZOFRAN-ODT) 4 MG disintegrating tablet, Take 1 tablet (4 mg total) by mouth every 8 (eight) hours as needed for nausea or vomiting., Disp: 20 tablet, Rfl: 0  Observations/Objective: Patient is well-developed,  well-nourished in no acute distress.  Resting comfortably at home.  Head is normocephalic, atraumatic.  No labored breathing. Speech is clear and coherent with logical content.  Patient is alert and oriented at baseline.   Assessment and Plan: 1. Viral illness (Primary) - ondansetron (ZOFRAN-ODT) 4 MG disintegrating tablet; Take 1 tablet (4 mg total) by mouth every 8 (eight) hours as needed for nausea or vomiting.  Dispense: 20 tablet; Refill: 0  Mainly symptoms of viral gastroenteritis but mild URI symptoms starting. Was just treated for the flu a 6 weeks ago. Will have him home COVID/flu test as a precaution. Suspect possible norovirus. Supportive measures and OTC medications reviewed BRAT diet reviewed. Zofran per orders. Strict ER precautions reviewed. He is to message [place] back if POC testing is positive. Work note provided. If positive for Flu/COVID, will start appropriate antiviral.  Follow Up Instructions: I discussed the assessment and treatment plan with the patient. The patient was provided an opportunity to ask questions and all were answered. The patient agreed with the plan and demonstrated an understanding of the instructions.  A copy of instructions were sent to the patient via MyChart unless otherwise noted below.   The patient was advised to call back or seek an in-person evaluation if the symptoms worsen or if the condition fails to improve as anticipated.    Zachary Climes, PA-C

## 2023-11-14 NOTE — Patient Instructions (Signed)
 Zachary Riley, thank you for joining [name], PA-C for today's virtual visit.  While this provider is not your primary care provider (PCP), if your PCP is located in our provider database this encounter information will be shared with them immediately following your visit.   A [facility] MyChart account gives you access to today's visit and all your visits, tests, and labs performed at [facility] " click here if you don't have a [name] account or go to [email or web address]  Consent: (Patient) Zachary Riley provided verbal consent for this virtual visit at the beginning of the encounter.  Current Medications:  Current Outpatient Medications:    ALPRAZolam (XANAX) 1 MG tablet, Take 1 tablet by mouth three times daily as needed, Disp: 90 tablet, Rfl: 0   benzonatate (TESSALON) 100 MG capsule, Take 1 capsule (100 mg total) by mouth 3 (three) times daily as needed for cough., Disp: 30 capsule, Rfl: 0   oseltamivir (TAMIFLU) 75 MG capsule, Take 1 capsule (75 mg total) by mouth 2 (two) times daily., Disp: 10 capsule, Rfl: 0   Medications ordered in this encounter:  No orders of the defined types were placed in this encounter.    *If you need refills on other medications prior to your next appointment, please contact your pharmacy*  Follow-Up: Call back or seek an in-person evaluation if the symptoms worsen or if the condition fails to improve as anticipated.  [facility] [phone]  Other Instructions Please keep hydrated and rest. Avoid Ibuprofen and similar medication. You can use OTC Tylenol for aches. Follow dietary recommendations below. Ok to continue use of OTC Imodium. Use the Zofran as directed, when needed for nausea and to prevent vomiting. If you become unable to keep in fluids, you need to be evaluated in person ASAP.  Food Choices to Help Relieve Diarrhea, Adult Diarrhea can make you feel weak and cause you  to become dehydrated. Dehydration is a condition in which there is not enough water or other fluids in the body. It is important to choose the right foods and drinks to: Relieve diarrhea. Replace lost fluids and nutrients. Prevent dehydration. What are tips for following this plan? Relieving diarrhea Avoid foods that make your diarrhea worse. These may include: Foods and drinks that are sweetened with high-fructose corn syrup, honey, or sweeteners such as xylitol, sorbitol, and mannitol. Check food labels for these ingredients. Fried, greasy, or spicy foods. Raw fruits and vegetables. Eat foods that are rich in probiotics. These include foods such as yogurt and fermented milk products. Probiotics can help increase healthy bacteria in your stomach and intestines (gastrointestinal or GI tract). This may help digestion and stop diarrhea. If you have lactose intolerance, avoid dairy products. These may make your diarrhea worse. Take medicine to help stop diarrhea only as told by your health care provider. Replacing nutrients  Eat bland, easy-to-digest foods in small amounts as you are able, until your diarrhea starts to get better. These foods include bananas, applesauce, rice, toast, and crackers. Over time, add nutrient-rich foods as your body tolerates them or as told by your health care provider. These include: Well-cooked protein foods, such as eggs, lean meats like fish or chicken without skin, and tofu. Peeled, seeded, and soft-cooked fruits and vegetables. Low-fat dairy products. Whole grains. Take vitamin and mineral supplements as told by your health care provider. Preventing dehydration  Start by sipping water or a solution to prevent dehydration (oral rehydration solution, or  ORS). This is a drink that helps replace fluids and minerals your body has lost. You can buy an ORS at pharmacies and retail stores. Try to drink at least 8-10 cups (2,000-2,500 mL) of fluid each day to help  replace lost fluids. If your urine is pale yellow, you are getting enough fluids. You may drink other liquids in addition to water, such as fruit juice that you have added water to (diluted fruit juice) or low-calorie sports drinks, as tolerated or as told by your health care provider. Avoid drinks with caffeine, such as coffee, tea, or soft drinks. Avoid alcohol. This information is not intended to replace advice given to you by your health care provider. Make sure you discuss any questions you have with your health care provider. Document Revised: 01/26/2022 Document Reviewed: 01/26/2022 Elsevier Patient Education  2024 [employer]vier Inc.   If you have been instructed to have an in-person evaluation today at a local Urgent Care facility, please use the link below. It will take you to a list of all of our available [facility], including address, phone number and hours of operation. Please do not delay care.  [facility]  If you or a family member do not have a primary care provider, use the link below to schedule a visit and establish care. When you choose a [facility] primary care physician or advanced practice provider, you gain a long-term partner in health. Find a Primary Care Provider  Learn more about [facility] in-office and virtual care options:  - Get Care Now

## 2024-01-30 ENCOUNTER — Telehealth: Admitting: Physician Assistant

## 2024-01-30 DIAGNOSIS — J069 Acute upper respiratory infection, unspecified: Secondary | ICD-10-CM | POA: Diagnosis not present

## 2024-01-30 MED ORDER — FLUTICASONE PROPIONATE 50 MCG/ACT NA SUSP
2.0000 | Freq: Every day | NASAL | 0 refills | Status: AC
Start: 2024-01-30 — End: ?

## 2024-01-30 MED ORDER — BENZONATATE 100 MG PO CAPS
100.0000 mg | ORAL_CAPSULE | Freq: Three times a day (TID) | ORAL | 0 refills | Status: DC | PRN
Start: 2024-01-30 — End: 2024-03-08

## 2024-01-30 NOTE — Progress Notes (Signed)

## 2024-03-08 ENCOUNTER — Telehealth: Admitting: Family Medicine

## 2024-03-08 DIAGNOSIS — M5440 Lumbago with sciatica, unspecified side: Secondary | ICD-10-CM | POA: Diagnosis not present

## 2024-03-08 MED ORDER — CYCLOBENZAPRINE HCL 10 MG PO TABS
10.0000 mg | ORAL_TABLET | Freq: Three times a day (TID) | ORAL | 0 refills | Status: DC | PRN
Start: 2024-03-08 — End: 2024-03-13

## 2024-03-08 MED ORDER — PREDNISONE 10 MG (21) PO TBPK
ORAL_TABLET | ORAL | 0 refills | Status: DC
Start: 2024-03-08 — End: 2024-06-05

## 2024-03-08 NOTE — Progress Notes (Signed)
 I have spent 5 minutes in review of e-visit questionnaire, review and updating patient chart, medical decision making and response to patient.   [name], PA-C

## 2024-03-08 NOTE — Progress Notes (Signed)
 Message sent to patient requesting further input regarding current symptoms. Awaiting patient response.

## 2024-03-08 NOTE — Progress Notes (Signed)

## 2024-03-13 ENCOUNTER — Ambulatory Visit
Admission: RE | Admit: 2024-03-13 | Discharge: 2024-03-13 | Disposition: A | Discharge status: Home / Self Care | Source: Ambulatory Visit | Attending: Family Medicine | Admitting: Family Medicine

## 2024-03-13 VITALS — BP 149/82 | HR 76 | Temp 97.7°F | Resp 16

## 2024-03-13 DIAGNOSIS — M5442 Lumbago with sciatica, left side: Secondary | ICD-10-CM | POA: Diagnosis not present

## 2024-03-13 MED ORDER — NAPROXEN 500 MG PO TABS: 500.0000 mg | ORAL_TABLET | Freq: Every day | ORAL | 0 refills | Status: DC | PRN

## 2024-03-13 MED ORDER — LIDOCAINE 5 % EX PTCH: 1.0000 | MEDICATED_PATCH | CUTANEOUS | 0 refills | Status: DC

## 2024-03-13 MED ORDER — METHOCARBAMOL 500 MG PO TABS
500.0000 mg | ORAL_TABLET | Freq: Three times a day (TID) | ORAL | 0 refills | Status: AC | PRN
Start: 2024-03-13 — End: 2024-03-18

## 2024-03-13 NOTE — ED Triage Notes (Signed)
 Pt presents to UC for c/o left sided lower back pain that shoots to left leg causing numbness and tingling of left leg x 5 days. Pt has been taking prednisone  and a muscle relaxer x4 days prescribed through an e-visit, but he continues to have pain. Pt reports hx back injury 7 years ago. Reports pain is worse with standing Also taking aleve 

## 2024-03-13 NOTE — ED Provider Notes (Signed)
 [facility]    CSN: [id number] Arrival date & time: 03/13/24  9078      History   Chief Complaint Chief Complaint  Patient presents with   Back Pain    I'm having back pain in my lower left back and tingling in my left leg from a previous injury. - Entered by patient    HPI Zachary Riley is a 58 y.o. male.  The past medical history of hyperlipidemia, GERD, migraine, back pain, inguinal hernia presents for low back pain.  Patient reports 5 days of a intermittent left lower back pain that radiates into his left buttock.  He endorses tingling of his left leg but denies any numbness or weakness.  No saddle paresthesia or bowel or bladder incontinence.  Denies any known injury or inciting event.  Does state he had an injury to his low back 7 years ago while at work when he was hit with a wooden crate.  Denies any surgeries but does state he was receiving injections in his back for some time after that.  States has had no issues with back pain until recently.  He did see his PCP via e-visit on 7/17 and was prescribed a prednisone  taper and Flexeril .  He states he has been taking as prescribed which helps some but he continues to have pain specifically after standing at work for periods of time.  He did take Aleve  this morning.  No other concerns at this time   Back Pain   Past Medical History:  Diagnosis Date   ALLERGIC RHINITIS 02/14/2008   ANXIETY 02/14/2008   BACK PAIN 02/18/2009   COMMON MIGRAINE 02/18/2009   DEPRESSION 02/14/2008   GERD 02/14/2008   HEMATOCHEZIA 04/29/2010   HYPERLIPIDEMIA 02/14/2008   IBS 02/14/2008   INGUINAL HERNIA 02/14/2008   OTITIS MEDIA, ACUTE, LEFT 03/02/2010   SINUSITIS- ACUTE-NOS 11/05/2009   URI 08/27/2008    Patient Active Problem List   Diagnosis Date Noted   History of frequent upper respiratory infection 11/12/2019   Recurrent sinusitis 11/12/2019   History of smoking 11/12/2019   Heartburn 11/12/2019   Febrile illness 10/28/2019   Wheezing  08/25/2018   Lower back pain 06/26/2015   Hyperglycemia 06/26/2015   Onychomycosis 06/07/2014   Carpal tunnel syndrome 07/03/2013   Encounter for well adult exam with abnormal findings 07/17/2012   HEMATOCHEZIA 04/29/2010   Migraine without aura 02/18/2009   Backache 02/18/2009   Hyperlipidemia 02/14/2008   Anxiety state 02/14/2008   Depression 02/14/2008   Other allergic rhinitis 02/14/2008   GERD 02/14/2008   Inguinal hernia 02/14/2008   IBS 02/14/2008    Past Surgical History:  Procedure Laterality Date   s/p [facility] 7/09         Home Medications    Prior to Admission medications   Medication Sig Start Date End Date Taking? Authorizing Provider  lidocaine  (LIDODERM ) 5 % Place 1 patch onto the skin daily. Apply to the area in the low back for 12 hours and remove for 12 hours 03/13/24  Yes Joie Reamer, Jodi R, NP  methocarbamol  (ROBAXIN ) 500 MG tablet Take 1 tablet (500 mg total) by mouth every 8 (eight) hours as needed for up to 5 days for muscle spasms. 03/13/24 03/18/24 Yes Cyree Chuong, Jodi R, NP  naproxen  (NAPROSYN ) 500 MG tablet Take 1 tablet (500 mg total) by mouth daily as needed. 03/14/24  Yes Shaquill Iseman, Jodi R, NP  fluticasone  (FLONASE ) 50 MCG/ACT nasal spray Place 2 sprays into both nostrils daily.  01/30/24   Vivienne Delon HERO, PA-C  predniSONE  (STERAPRED UNI-PAK 21 TAB) 10 MG (21) TBPK tablet Take following package directions 03/08/24   Gladis Elsie BROCKS, PA-C    Family History Family History  Problem Relation Age of Onset   Cancer Mother        colon   Cancer Father        colon   Cancer Other        lung   Diabetes Other    Hypertension Other     Social History Social History   Tobacco Use   Smoking status: Former   Smokeless tobacco: Never  Substance Use Topics   Alcohol use: Not Currently   Drug use: No     Allergies   Esomeprazole magnesium and Penicillins   Review of Systems Review of Systems  Musculoskeletal:  Positive for back pain.     Physical  Exam Triage Vital Signs ED Triage Vitals  Encounter Vitals Group     BP 03/13/24 0933 (!) 149/82     Girls Systolic BP Percentile --      Girls Diastolic BP Percentile --      Boys Systolic BP Percentile --      Boys Diastolic BP Percentile --      Pulse Rate 03/13/24 0933 76     Resp 03/13/24 0933 16     Temp 03/13/24 0933 97.7 F (36.5 C)     Temp Source 03/13/24 0933 Oral     SpO2 03/13/24 0933 96 %     Weight --      Height --      Head Circumference --      Peak Flow --      Pain Score 03/13/24 0929 5     Pain Loc --      Pain Education --      Exclude from Growth Chart --    No data found.  Updated Vital Signs BP (!) 149/82 (BP Location: Right Arm)   Pulse 76   Temp 97.7 F (36.5 C) (Oral)   Resp 16   SpO2 96%   Visual Acuity Right Eye Distance:   Left Eye Distance:   Bilateral Distance:    Right Eye Near:   Left Eye Near:    Bilateral Near:     Physical Exam Vitals and nursing note reviewed.  Constitutional:      General: He is not in acute distress.    Appearance: Normal appearance. He is not ill-appearing.  HENT:     Head: Normocephalic and atraumatic.  Eyes:     Pupils: Pupils are equal, round, and reactive to light.  Cardiovascular:     Rate and Rhythm: Normal rate.  Pulmonary:     Effort: Pulmonary effort is normal.  Musculoskeletal:     Thoracic back: Normal.     Lumbar back: No swelling, edema, deformity, signs of trauma, lacerations, spasms, tenderness or bony tenderness. Normal range of motion. Positive left straight leg raise test. Negative right straight leg raise test. No scoliosis.     Comments: Strength is 5 out of 5 bilateral lower extremities.  There is no tenderness with palpation to the left lower back but he does have a positive straight leg raise on the left.  Skin:    General: Skin is warm and dry.  Neurological:     General: No focal deficit present.     Mental Status: He is alert and oriented to person, place, and time.  Psychiatric:        Mood and Affect: Mood normal.        Behavior: Behavior normal.      UC Treatments / Results  Labs (all labs ordered are listed, but only abnormal results are displayed) Labs Reviewed - No data to display  EKG   Radiology No results found.  Procedures Procedures (including critical care time)  Medications Ordered in UC Medications - No data to display  Initial Impression / Assessment and Plan / UC Course  I have reviewed the triage vital signs and the nursing notes.  Pertinent labs & imaging results that were available during my care of the patient were reviewed by me and considered in my medical decision making (see chart for details).     Reviewed exam and symptoms with patient.  No red flags.  Will stop Flexeril  and do trial of Robaxin .  He may continue the prednisone  taper as previously prescribed.  Will add on Lidoderm  patch.  He will start Aleve  daily tomorrow, 7/23.  Discussed OTC Tylenol , heat and rest.  Advised PCP follow-up 2 to 3 days for recheck.  Strict ER precautions reviewed and patient verbalized understanding. Final Clinical Impressions(s) / UC Diagnoses   Final diagnoses:  Acute left-sided low back pain with left-sided sciatica     Discharge Instructions      Stop Flexeril  and start Robaxin  muscle relaxer 3 times a day as needed.  Please and this medication will make you drowsy.  Do not drink alcohol or drive on this medication.  We do the Lidoderm  patch to the low back as needed.  You leave it in place for 12 hours and remove for 12 hours.  You may start naproxen  once a day tomorrow, 7/23.  You may take Tylenol  over-the-counter as needed.  Continue the prednisone  that was prescribed by your PCP.  You do heat and rest and please follow-up with your PCP in 2 to 3 days for recheck.  Please go to the ER for any worsening symptoms.  Hope you feel better soon!    ED Prescriptions     Medication Sig Dispense Auth. Provider    methocarbamol  (ROBAXIN ) 500 MG tablet Take 1 tablet (500 mg total) by mouth every 8 (eight) hours as needed for up to 5 days for muscle spasms. 15 tablet Stepehn Eckard, Jodi R, NP   lidocaine  (LIDODERM ) 5 % Place 1 patch onto the skin daily. Apply to the area in the low back for 12 hours and remove for 12 hours 10 patch Bijou Easler, Jodi R, NP   naproxen  (NAPROSYN ) 500 MG tablet Take 1 tablet (500 mg total) by mouth daily as needed. 5 tablet Icess Bertoni, Jodi R, NP      PDMP not reviewed this encounter.   Loreda Myla SAUNDERS, NP 03/13/24 [phone]

## 2024-03-13 NOTE — Discharge Instructions (Signed)
 Stop Flexeril  and start Robaxin  muscle relaxer 3 times a day as needed.  Please and this medication will make you drowsy.  Do not drink alcohol or drive on this medication.  We do the Lidoderm  patch to the low back as needed.  You leave it in place for 12 hours and remove for 12 hours.  You may start naproxen  once a day tomorrow, 7/23.  You may take Tylenol  over-the-counter as needed.  Continue the prednisone  that was prescribed by your PCP.  You do heat and rest and please follow-up with your PCP in 2 to 3 days for recheck.  Please go to the ER for any worsening symptoms.  Hope you feel better soon!

## 2024-05-31 ENCOUNTER — Telehealth: Admitting: Physician Assistant

## 2024-05-31 DIAGNOSIS — J019 Acute sinusitis, unspecified: Secondary | ICD-10-CM

## 2024-05-31 DIAGNOSIS — B9689 Other specified bacterial agents as the cause of diseases classified elsewhere: Secondary | ICD-10-CM

## 2024-05-31 MED ORDER — DOXYCYCLINE HYCLATE 100 MG PO TABS: 100.0000 mg | ORAL_TABLET | Freq: Two times a day (BID) | ORAL | 0 refills | Status: DC

## 2024-05-31 NOTE — Progress Notes (Signed)

## 2024-06-05 ENCOUNTER — Ambulatory Visit
Admission: RE | Admit: 2024-06-05 | Discharge: 2024-06-05 | Disposition: A | Discharge status: Home / Self Care | Source: Ambulatory Visit | Attending: Family Medicine | Admitting: Family Medicine

## 2024-06-05 ENCOUNTER — Ambulatory Visit: Payer: Self-pay | Admitting: Nurse Practitioner

## 2024-06-05 ENCOUNTER — Ambulatory Visit (INDEPENDENT_AMBULATORY_CARE_PROVIDER_SITE_OTHER)

## 2024-06-05 VITALS — BP 132/84 | HR 63 | Temp 97.6°F | Resp 16

## 2024-06-05 DIAGNOSIS — J0110 Acute frontal sinusitis, unspecified: Secondary | ICD-10-CM

## 2024-06-05 DIAGNOSIS — R059 Cough, unspecified: Secondary | ICD-10-CM | POA: Diagnosis not present

## 2024-06-05 DIAGNOSIS — R051 Acute cough: Secondary | ICD-10-CM

## 2024-06-05 LAB — POC COVID19/FLU A&B COMBO
Covid Antigen, POC: NEGATIVE
Influenza A Antigen, POC: NEGATIVE
Influenza B Antigen, POC: NEGATIVE

## 2024-06-05 MED ORDER — CEFPODOXIME PROXETIL 200 MG PO TABS: 200.0000 mg | ORAL_TABLET | Freq: Two times a day (BID) | ORAL | 0 refills | Status: AC

## 2024-06-05 MED ORDER — PREDNISONE 20 MG PO TABS: 40.0000 mg | ORAL_TABLET | Freq: Every day | ORAL | 0 refills | Status: AC

## 2024-06-05 NOTE — ED Triage Notes (Addendum)
 Pt present with c/o chest swelling and tightness. States he has sore muscles and brain fog yesterday. Pt states he is coughing and sneezing a lot. C/o scratchy and sore throat x two weeks  Home interventions: alka seltzer, otc decongestants and tylenol 

## 2024-06-05 NOTE — ED Provider Notes (Signed)
 [facility]    CSN: [id number] Arrival date & time: 06/05/24  1354      History   Chief Complaint Chief Complaint  Patient presents with   Nasal Congestion    I have been sick for 2 weeks now, I was given an antibiotic last Thursday but so far it hasn't helped.   It felt like either Covid or the flu but now I feel like I have bronchitis. - Entered by patient    HPI Zachary Riley is a 58 y.o. male  presents for evaluation of URI symptoms for 2 weeks. Patient reports associated symptoms of sinus pressure/pain with cough and congestion, loose stools, subjective fever/chills. Denies N/V, sore throat, ear pain.  Patient did an e-visit on 10/9 for same symptoms.  He was started on doxycycline  for sinusitis.  Patient states he has been taking as prescribed and was feeling better until yesterday when he felt like his symptoms worsened and developed body aches, chills with subjective fevers, brain fog. Patient does not have a hx of asthma. Patient does not an active smoker.   Pt has no other concerns at this time.   HPI  Past Medical History:  Diagnosis Date   ALLERGIC RHINITIS 02/14/2008   ANXIETY 02/14/2008   BACK PAIN 02/18/2009   COMMON MIGRAINE 02/18/2009   DEPRESSION 02/14/2008   GERD 02/14/2008   HEMATOCHEZIA 04/29/2010   HYPERLIPIDEMIA 02/14/2008   IBS 02/14/2008   INGUINAL HERNIA 02/14/2008   OTITIS MEDIA, ACUTE, LEFT 03/02/2010   SINUSITIS- ACUTE-NOS 11/05/2009   URI 08/27/2008    Patient Active Problem List   Diagnosis Date Noted   History of frequent upper respiratory infection 11/12/2019   Recurrent sinusitis 11/12/2019   History of smoking 11/12/2019   Heartburn 11/12/2019   Febrile illness 10/28/2019   Wheezing 08/25/2018   Lower back pain 06/26/2015   Hyperglycemia 06/26/2015   Onychomycosis 06/07/2014   Carpal tunnel syndrome 07/03/2013   Encounter for well adult exam with abnormal findings 07/17/2012   HEMATOCHEZIA 04/29/2010   Migraine without aura  02/18/2009   Backache 02/18/2009   Hyperlipidemia 02/14/2008   Anxiety state 02/14/2008   Depression 02/14/2008   Other allergic rhinitis 02/14/2008   GERD 02/14/2008   Inguinal hernia 02/14/2008   IBS 02/14/2008    Past Surgical History:  Procedure Laterality Date   s/p [facility] 7/09         Home Medications    Prior to Admission medications   Medication Sig Start Date End Date Taking? Authorizing Provider  cefpodoxime (VANTIN) 200 MG tablet Take 1 tablet (200 mg total) by mouth 2 (two) times daily for 7 days. 06/05/24 06/12/24 Yes Aamani Moose, Jodi R, NP  predniSONE  (DELTASONE ) 20 MG tablet Take 2 tablets (40 mg total) by mouth daily with breakfast for 5 days. 06/05/24 06/10/24 Yes Teofil Maniaci, Jodi R, NP  fluticasone  (FLONASE ) 50 MCG/ACT nasal spray Place 2 sprays into both nostrils daily. 01/30/24   Vivienne Delon HERO, PA-C  lidocaine  (LIDODERM ) 5 % Place 1 patch onto the skin daily. Apply to the area in the low back for 12 hours and remove for 12 hours 03/13/24   Zamaria Brazzle, Jodi R, NP  naproxen  (NAPROSYN ) 500 MG tablet Take 1 tablet (500 mg total) by mouth daily as needed. 03/14/24   Loreda Myla SAUNDERS, NP    Family History Family History  Problem Relation Age of Onset   Cancer Mother        colon   Cancer Father  colon   Cancer Other        lung   Diabetes Other    Hypertension Other     Social History Social History   Tobacco Use   Smoking status: Former   Smokeless tobacco: Never  Substance Use Topics   Alcohol use: Not Currently   Drug use: No     Allergies   Esomeprazole magnesium and Penicillins   Review of Systems Review of Systems  Constitutional:  Positive for chills and fever.  HENT:  Positive for congestion.   Respiratory:  Positive for cough.   Gastrointestinal:  Positive for diarrhea.  Musculoskeletal:  Positive for myalgias.     Physical Exam Triage Vital Signs ED Triage Vitals  Encounter Vitals Group     BP 06/05/24 1450 132/84     Girls  Systolic BP Percentile --      Girls Diastolic BP Percentile --      Boys Systolic BP Percentile --      Boys Diastolic BP Percentile --      Pulse Rate 06/05/24 1443 63     Resp 06/05/24 1443 16     Temp 06/05/24 1443 97.6 F (36.4 C)     Temp Source 06/05/24 1443 Oral     SpO2 06/05/24 1443 98 %     Weight --      Height --      Head Circumference --      Peak Flow --      Pain Score 06/05/24 1449 0     Pain Loc --      Pain Education --      Exclude from Growth Chart --    No data found.  Updated Vital Signs BP 132/84   Pulse 63   Temp 97.6 F (36.4 C) (Oral)   Resp 16   SpO2 98%   Visual Acuity Right Eye Distance:   Left Eye Distance:   Bilateral Distance:    Right Eye Near:   Left Eye Near:    Bilateral Near:     Physical Exam Vitals and nursing note reviewed.  Constitutional:      General: He is not in acute distress.    Appearance: Normal appearance. He is not ill-appearing or toxic-appearing.  HENT:     Head: Normocephalic and atraumatic.     Right Ear: Tympanic membrane and ear canal normal.     Left Ear: Tympanic membrane and ear canal normal.     Nose: Congestion present.     Right Turbinates: Pale. Not swollen.     Left Turbinates: Pale. Not swollen.     Right Sinus: Frontal sinus tenderness present. No maxillary sinus tenderness.     Left Sinus: Frontal sinus tenderness present. No maxillary sinus tenderness.     Mouth/Throat:     Mouth: Mucous membranes are moist.     Pharynx: No oropharyngeal exudate or posterior oropharyngeal erythema.  Eyes:     Pupils: Pupils are equal, round, and reactive to light.  Cardiovascular:     Rate and Rhythm: Normal rate and regular rhythm.     Heart sounds: Normal heart sounds.  Pulmonary:     Effort: Pulmonary effort is normal.     Breath sounds: Normal breath sounds. No wheezing or rhonchi.  Musculoskeletal:     Cervical back: Normal range of motion and neck supple.  Lymphadenopathy:     Cervical: No  cervical adenopathy.  Skin:    General: Skin is warm and dry.  Neurological:     General: No focal deficit present.     Mental Status: He is alert and oriented to person, place, and time.  Psychiatric:        Mood and Affect: Mood normal.        Behavior: Behavior normal.      UC Treatments / Results  Labs (all labs ordered are listed, but only abnormal results are displayed) Labs Reviewed  POC COVID19/FLU A&B COMBO    EKG   Radiology DG Chest 2 View Result Date: 06/05/2024 EXAM: 2 VIEW(S) XRAY OF THE CHEST 06/05/2024 03:05:21 PM COMPARISON: 03/21/2019 CLINICAL HISTORY: cough/congestion 2 weeks worsened yesterday. FINDINGS: LUNGS AND PLEURA: No focal pulmonary opacity. No pulmonary edema. No pleural effusion. No pneumothorax. HEART AND MEDIASTINUM: No acute abnormality of the cardiac and mediastinal silhouettes. BONES AND SOFT TISSUES: No acute osseous abnormality. IMPRESSION: 1. No acute cardiopulmonary process. Electronically signed by: Lynwood Seip MD 06/05/2024 03:17 PM EDT RP Workstation: [id number]    Procedures Procedures (including critical care time)  Medications Ordered in UC Medications - No data to display  Initial Impression / Assessment and Plan / UC Course  I have reviewed the triage vital signs and the nursing notes.  Pertinent labs & imaging results that were available during my care of the patient were reviewed by me and considered in my medical decision making (see chart for details).     Reviewed exam and symptoms with patient.  Chest x-ray negative and negative COVID and flu.  Patient on doxycycline  for sinusitis without improvement, will have him stop this and start cefpodoxime twice daily.  Will also add on prednisone  daily for 5 days.  Discussed potential for worsening symptoms secondary to concurrent viral infection.  Advise rest fluids and PCP follow-up if symptoms do not improve.  ER precautions reviewed. Final Clinical Impressions(s) / UC  Diagnoses   Final diagnoses:  Acute cough  Acute frontal sinusitis, recurrence not specified     Discharge Instructions      You tested negative for COVID and flu and your chest x-ray was negative for pneumonia.  Stop doxycycline  and start cefpodoxime twice daily for 7 days.  Also start prednisone  daily for 5 days.  Nasal rinses as tolerated.  Lots of rest and fluids.  Please follow-up with your PCP if your symptoms are not improving.  Please go to the ER for any worsening symptoms.  I hope you feel better soon!     ED Prescriptions     Medication Sig Dispense Auth. Provider   cefpodoxime (VANTIN) 200 MG tablet Take 1 tablet (200 mg total) by mouth 2 (two) times daily for 7 days. 14 tablet Ashaunti Treptow, Jodi R, NP   predniSONE  (DELTASONE ) 20 MG tablet Take 2 tablets (40 mg total) by mouth daily with breakfast for 5 days. 10 tablet Jill Stopka, Jodi R, NP      PDMP not reviewed this encounter.   Loreda Myla SAUNDERS, NP 06/05/24 [phone]

## 2024-06-05 NOTE — Discharge Instructions (Signed)
 You tested negative for COVID and flu and your chest x-ray was negative for pneumonia.  Stop doxycycline  and start cefpodoxime twice daily for 7 days.  Also start prednisone  daily for 5 days.  Nasal rinses as tolerated.  Lots of rest and fluids.  Please follow-up with your PCP if your symptoms are not improving.  Please go to the ER for any worsening symptoms.  I hope you feel better soon!

## 2024-07-25 ENCOUNTER — Ambulatory Visit

## 2024-07-25 ENCOUNTER — Telehealth: Admitting: Physician Assistant

## 2024-07-25 ENCOUNTER — Encounter: Payer: Self-pay | Admitting: Physician Assistant

## 2024-07-25 DIAGNOSIS — R6889 Other general symptoms and signs: Secondary | ICD-10-CM | POA: Diagnosis not present

## 2024-07-25 DIAGNOSIS — J00 Acute nasopharyngitis [common cold]: Secondary | ICD-10-CM | POA: Diagnosis not present

## 2024-07-25 MED ORDER — OSELTAMIVIR PHOSPHATE 75 MG PO CAPS: 75.0000 mg | ORAL_CAPSULE | Freq: Two times a day (BID) | ORAL | 0 refills | Status: AC

## 2024-07-25 MED ORDER — ONDANSETRON 4 MG PO TBDP: 4.0000 mg | ORAL_TABLET | Freq: Three times a day (TID) | ORAL | 0 refills | Status: AC | PRN

## 2024-07-25 NOTE — Progress Notes (Signed)
 Virtual Visit Consent   Zachary Riley, you are scheduled for a virtual visit with a [facility] provider today. Just as with appointments in the office, your consent must be obtained to participate. Your consent will be active for this visit and any virtual visit you may have with one of our providers in the next 365 days. If you have a MyChart account, a copy of this consent can be sent to you electronically.  As this is a virtual visit, video technology does not allow for your provider to perform a traditional examination. This may limit your provider's ability to fully assess your condition. If your provider identifies any concerns that need to be evaluated in person or the need to arrange testing (such as labs, EKG, etc.), we will make arrangements to do so. Although advances in technology are sophisticated, we cannot ensure that it will always work on either your end or our end. If the connection with a video visit is poor, the visit may have to be switched to a telephone visit. With either a video or telephone visit, we are not always able to ensure that we have a secure connection.  By engaging in this virtual visit, you consent to the provision of healthcare and authorize for your insurance to be billed (if applicable) for the services provided during this visit. Depending on your insurance coverage, you may receive a charge related to this service.  I need to obtain your verbal consent now. Are you willing to proceed with your visit today? Zachary Riley has provided verbal consent on 07/25/2024 for a virtual visit (video or telephone). [name], [place]  Date: 07/25/2024 9:24 AM   Virtual Visit via Video Note   I, [name], connected with  Zachary Riley  ([id number], 10-16-1965) on 07/25/24 at  9:00 AM EST by a video-enabled telemedicine application and verified that I am speaking with the correct person using two identifiers.  Location: Patient: Virtual Visit  Location Patient: Home Provider: Virtual Visit Location Provider: Home Office   I discussed the limitations of evaluation and management by telemedicine and the availability of in person appointments. The patient expressed understanding and agreed to proceed.    History of Present Illness: Zachary Riley is a 58 y.o. who identifies as a male who was assigned male at birth, and is being seen today for abrupt onset yesterday of fatigue, body aches, headache, rhinorrhea and sore throat. Has felt feverish but has not checked temperature today. Has taken Aleve  this morning for aches and chills. Some associated nausea without vomiting. Mild SOB with exertion only but alleviated with his inhaler. Notes coworker sick last week but unsure with what. Ordered a home COVID/Flu test but will not be delivered until tonight.   HPI: HPI  Problems:  Patient Active Problem List   Diagnosis Date Noted   History of frequent upper respiratory infection 11/12/2019   Recurrent sinusitis 11/12/2019   History of smoking 11/12/2019   Heartburn 11/12/2019   Febrile illness 10/28/2019   Wheezing 08/25/2018   Lower back pain 06/26/2015   Hyperglycemia 06/26/2015   Onychomycosis 06/07/2014   Carpal tunnel syndrome 07/03/2013   Encounter for well adult exam with abnormal findings 07/17/2012   HEMATOCHEZIA 04/29/2010   Migraine without aura 02/18/2009   Backache 02/18/2009   Hyperlipidemia 02/14/2008   Anxiety state 02/14/2008   Depression 02/14/2008   Other allergic rhinitis 02/14/2008   GERD 02/14/2008   Inguinal hernia 02/14/2008   IBS  02/14/2008    Allergies:  Allergies  Allergen Reactions   Esomeprazole Magnesium     REACTION: per patient   Penicillins     REACTION: GI upset   Medications:  Current Outpatient Medications:    ondansetron  (ZOFRAN -ODT) 4 MG disintegrating tablet, Take 1 tablet (4 mg total) by mouth every 8 (eight) hours as needed for nausea or vomiting., Disp: 20 tablet, Rfl: 0    oseltamivir  (TAMIFLU ) 75 MG capsule, Take 1 capsule (75 mg total) by mouth 2 (two) times daily for 5 days., Disp: 10 capsule, Rfl: 0   fluticasone  (FLONASE ) 50 MCG/ACT nasal spray, Place 2 sprays into both nostrils daily., Disp: 16 g, Rfl: 0  Observations/Objective: Patient is well-developed, well-nourished in no acute distress.  Resting comfortably  at home.  Head is normocephalic, atraumatic.  No labored breathing.  Speech is clear and coherent with logical content.  Patient is alert and oriented at baseline.   Assessment and Plan: 1. Flu-like symptoms (Primary) - oseltamivir  (TAMIFLU ) 75 MG capsule; Take 1 capsule (75 mg total) by mouth 2 (two) times daily for 5 days.  Dispense: 10 capsule; Refill: 0  2. Coryza  Suspected influenza. Will have him test for COVID once the test arrives as a precaution but since that may not be until tonight or tomorrow AM and already on second day of symptoms, will go ahead and treat for suspected flu. Tamiflu  per orders. Zofran  for nausea. Supportive measures and OTC medications  Follow Up Instructions: I discussed the assessment and treatment plan with the patient. The patient was provided an opportunity to ask questions and all were answered. The patient agreed with the plan and demonstrated an understanding of the instructions.  A copy of instructions were sent to the patient via MyChart unless otherwise noted below.   The patient was advised to call back or seek an in-person evaluation if the symptoms worsen or if the condition fails to improve as anticipated.    [name], PA-C

## 2024-07-25 NOTE — Patient Instructions (Signed)
 Zachary Riley, thank you for joining Zachary Velma Lunger, PA-C for today's virtual visit.  While this provider is not your primary care provider (PCP), if your PCP is located in our provider database this encounter information will be shared with them immediately following your visit.   A [facility] MyChart account gives you access to today's visit and all your visits, tests, and labs performed at [facility]  click here if you don't have a [facility] MyChart account or go to [email or web address]  Consent: (Patient) Zachary Riley provided verbal consent for this virtual visit at the beginning of the encounter.  Current Medications:  Current Outpatient Medications:    fluticasone  (FLONASE ) 50 MCG/ACT nasal spray, Place 2 sprays into both nostrils daily., Disp: 16 g, Rfl: 0   lidocaine  (LIDODERM ) 5 %, Place 1 patch onto the skin daily. Apply to the area in the low back for 12 hours and remove for 12 hours, Disp: 10 patch, Rfl: 0   naproxen  (NAPROSYN ) 500 MG tablet, Take 1 tablet (500 mg total) by mouth daily as needed., Disp: 5 tablet, Rfl: 0   Medications ordered in this encounter:  No orders of the defined types were placed in this encounter.    *If you need refills on other medications prior to your next appointment, please contact your pharmacy*  Follow-Up: Call back or seek an in-person evaluation if the symptoms worsen or if the condition fails to improve as anticipated.  [facility] [phone]  Other Instructions Message me as soon as you have your test results, just in case we need to change anything.  Please keep well-hydrated and try to get plenty of rest. If you have a humidifier, place it in the bedroom and run it at night. Start a saline nasal rinse for nasal congestion. You can consider use of a nasal steroid spray like Flonase  or Nasacort OTC. You can alternate between Tylenol  and Ibuprofen if needed for fever, body aches,  headache and/or throat pain. Salt water-gargles and chloraseptic spray can be very beneficial for sore throat. Mucinex-DM for congestion or cough. Please take all prescribed medications as directed.  Remain out of work until [employer] for 24 hours without a fever-reducing medication, and you are feeling better.  You should mask until symptoms are resolved.  If anything worsens despite treatment, you need to be evaluated in-person. Please do not delay care.  Influenza, Adult Influenza is also called the flu. It is an infection in the lungs, nose, and throat (respiratory tract). It spreads easily from person to person (is contagious). The flu causes symptoms that are like a cold, along with high fever and body aches. What are the causes? This condition is caused by the influenza virus. You can get the virus by: Breathing in droplets that are in the air after a person infected with the flu coughed or sneezed. Touching something that has the virus on it and then touching your mouth, nose, or eyes. What increases the risk? Certain things may make you more likely to get the flu. These include: Not washing your hands often. Having close contact with many people during cold and flu season. Touching your mouth, eyes, or nose without first washing your hands. Not getting a flu shot every year. You may have a higher risk for the flu, and serious problems, such as a lung infection (pneumonia), if you: Are older than 65. Are pregnant. Have a weakened disease-fighting system (immune system) because of a disease or  because you are taking certain medicines. Have a long-term (chronic) condition, such as: Heart, kidney, or lung disease. Diabetes. Asthma. Have a liver disorder. Are very overweight (morbidly obese). Have anemia. What are the signs or symptoms? Symptoms usually begin suddenly and last 4-14 days. They may include: Fever and chills. Headaches, body aches, or muscle aches. Sore  throat. Cough. Runny or stuffy (congested) nose. Feeling discomfort in your chest. Not wanting to eat as much as normal. Feeling weak or tired. Feeling dizzy. Feeling sick to your stomach or throwing up. How is this treated? If the flu is found early, you can be treated with antiviral medicine. This can help to reduce how bad the illness is and how long it lasts. This may be given by mouth or through an IV tube. Taking care of yourself at home can help your symptoms get better. Your doctor may want you to: Take over-the-counter medicines. Drink plenty of fluids. The flu often goes away on its own. If you have very bad symptoms or other problems, you may be treated in a hospital. Follow these instructions at home:     Activity Rest as needed. Get plenty of sleep. Stay home from work or school as told by your doctor. Do not leave home until you do not have a fever for 24 hours without taking medicine. Leave home only to go to your doctor. Eating and drinking Take an ORS (oral rehydration solution). This is a drink that is sold at pharmacies and stores. Drink enough fluid to keep your pee pale yellow. Drink clear fluids in small amounts as you are able. Clear fluids include: Water. Ice chips. Fruit juice mixed with water. Low-calorie sports drinks. Eat bland foods that are easy to digest. Eat small amounts as you are able. These foods include: Bananas. Applesauce. Rice. Lean meats. Toast. Crackers. Do not eat or drink: Fluids that have a lot of sugar or caffeine. Alcohol. Spicy or fatty foods. General instructions Take over-the-counter and prescription medicines only as told by your doctor. Use a cool mist humidifier to add moisture to the air in your home. This can make it easier for you to breathe. When using a cool mist humidifier, clean it daily. Empty water and replace with clean water. Cover your mouth and nose when you cough or sneeze. Wash your hands with soap and  water often and for at least 20 seconds. This is also important after you cough or sneeze. If you cannot use soap and water, use alcohol-based hand sanitizer. Keep all follow-up visits. How is this prevented?  Get a flu shot every year. You may get the flu shot in late summer, fall, or winter. Ask your doctor when you should get your flu shot. Avoid contact with people who are sick during fall and winter. This is cold and flu season. Contact a doctor if: You get new symptoms. You have: Chest pain. Watery poop (diarrhea). A fever. Your cough gets worse. You start to have more mucus. You feel sick to your stomach. You throw up. Get help right away if you: Have shortness of breath. Have trouble breathing. Have skin or nails that turn a bluish color. Have very bad pain or stiffness in your neck. Get a sudden headache. Get sudden pain in your face or ear. Cannot eat or drink without throwing up. These symptoms may represent a serious problem that is an emergency. Get medical help right away. Call your local emergency services (911 in the [place] Do not wait  to see if the symptoms will go away. Do not drive yourself to the hospital. Summary Influenza is also called the flu. It is an infection in the lungs, nose, and throat. It spreads easily from person to person. Take over-the-counter and prescription medicines only as told by your doctor. Getting a flu shot every year is the best way to not get the flu. This information is not intended to replace advice given to you by your health care provider. Make sure you discuss any questions you have with your health care provider. Document Revised: 03/28/2020 Document Reviewed: 03/28/2020 Elsevier Patient Education  2023 Elsevier Inc.      If you have been instructed to have an in-person evaluation today at a local Urgent Care facility, please use the link below. It will take you to a list of all of our available [facility],  including address, phone number and hours of operation. Please do not delay care.  [facility]  If you or a family member do not have a primary care provider, use the link below to schedule a visit and establish care. When you choose a [facility] primary care physician or advanced practice provider, you gain a long-term partner in health. Find a Primary Care Provider  Learn more about [facility]'s in-office and virtual care options: [facility] - Get Care Now

## 2024-08-29 ENCOUNTER — Telehealth: Admitting: Nurse Practitioner

## 2024-08-29 DIAGNOSIS — R6889 Other general symptoms and signs: Secondary | ICD-10-CM | POA: Diagnosis not present

## 2024-08-29 MED ORDER — LIDOCAINE VISCOUS HCL 2 % MT SOLN: 15.0000 mL | OROMUCOSAL | 0 refills | Status: AC | PRN

## 2024-08-29 MED ORDER — BENZONATATE 100 MG PO CAPS: 100.0000 mg | ORAL_CAPSULE | Freq: Three times a day (TID) | ORAL | 0 refills | Status: AC | PRN

## 2024-08-29 MED ORDER — OSELTAMIVIR PHOSPHATE 75 MG PO CAPS: 75.0000 mg | ORAL_CAPSULE | Freq: Two times a day (BID) | ORAL | 0 refills | Status: AC

## 2024-08-29 NOTE — Progress Notes (Signed)
 E visit for Flu like symptoms   We are sorry that you are not feeling well.  Here is how we plan to help! Based on what you have shared with me it looks like you may have a respiratory virus that may be influenza.  Influenza or the flu is  an infection caused by a respiratory virus. The flu virus is highly contagious and persons who did not receive their yearly flu vaccination may catch the flu from close contact.  We have anti-viral medications to treat the viruses that cause this infection. They are not a cure and only shorten the course of the infection. These prescriptions are most effective when they are given within the first 2 days of flu symptoms. Antiviral medications are indicated if you have a high risk of complications from the flu. You should  also consider an antiviral medication if you are in close contact with someone who is at risk. These medications can help patients avoid complications from the flu but have side effects that you should know.   Possible side effects from Tamiflu  or oseltamivir  include nausea, vomiting, diarrhea, dizziness, headaches, eye redness, sleep problems or other respiratory symptoms. You should not take Tamiflu  if you have an allergy  to oseltamivir  or any to the ingredients in Tamiflu .  Based upon your symptoms and potential risk factors I have prescribed Oseltamivir  (Tamiflu ).  It has been sent to your designated pharmacy.  You will take one 75 mg capsule orally twice a day for the next 5 days.   For nasal congestion, you may use an oral decongestant such as Mucinex D or if you have glaucoma or high blood pressure use plain Mucinex.  Saline nasal spray or nasal drops can help and can safely be used as often as needed for congestion.  If you have a sore or scratchy throat, use a saltwater gargle-  to  teaspoon of salt dissolved in a 4-ounce to 8-ounce glass of warm water.  Gargle the solution for approximately 15-30 seconds and then spit.  It is  important not to swallow the solution.  You can also use throat lozenges/cough drops and Chloraseptic spray to help with throat pain or discomfort.  Warm or cold liquids can also be helpful in relieving throat pain.  For headache, pain or general discomfort, you can use Ibuprofen or Tylenol  as directed.   Some authorities believe that zinc sprays or the use of Echinacea may shorten the course of your symptoms.  I have prescribed the following medications to help lessen symptoms: I have prescribed Tessalon  Perles 100 mg. You may take 1-2 capsules every 8 hours as needed for cough and a gargle you can use to help with the sore throat if needed.  Meds ordered this encounter  Medications   oseltamivir  (TAMIFLU ) 75 MG capsule    Sig: Take 1 capsule (75 mg total) by mouth 2 (two) times daily for 5 days.    Dispense:  10 capsule    Refill:  0   benzonatate  (TESSALON ) 100 MG capsule    Sig: Take 1 capsule (100 mg total) by mouth 3 (three) times daily as needed.    Dispense:  30 capsule    Refill:  0   lidocaine  (XYLOCAINE ) 2 % solution    Sig: Use as directed 15 mLs in the mouth or throat every 4 (four) hours as needed (sore throat). Gargle and spit    Dispense:  100 mL    Refill:  0  You are to isolate at home until you have been fever-free for at least 24 hours without a fever-reducing medication, and symptoms have been steadily improving for 24 hours.  If you must be around other household members who do not have symptoms, you need to make sure that both you and the family members are masking consistently with a high-quality mask.  If you note any worsening of symptoms despite treatment, please seek an in-person evaluation ASAP. If you note any significant shortness of breath or any chest pain, please seek ED evaluation. Please do not delay care!  ANYONE WHO HAS FLU SYMPTOMS SHOULD: Stay home. The flu is highly contagious and going out or to work exposes others! Be sure to drink plenty  of fluids. Water is fine as well as fruit juices, sodas and electrolyte beverages. You may want to stay away from caffeine or alcohol. If you are nauseated, try taking small sips of liquids. How do you know if you are getting enough fluid? Your urine should be a pale yellow or almost colorless. Get rest. Taking a steamy shower or using a humidifier may help nasal congestion and ease sore throat pain. Using a saline nasal spray works much the same way. Cough drops, hard candies and sore throat lozenges may ease your cough. Line up a caregiver. Have someone check on you regularly.  GET HELP RIGHT AWAY IF: You cannot keep down liquids or your medications. You become short of breath Your fell like you are going to pass out or loose consciousness. Your symptoms persist after you have completed your treatment plan  MAKE SURE YOU  Understand these instructions. Will watch your condition. Will get help right away if you are not doing well or get worse.  Your e-visit answers were reviewed by a board certified advanced clinical practitioner to complete your personal care plan.  Depending on the condition, your plan could have included both over the counter or prescription medications.  If there is a problem please reply  once you have received a response from your provider.  Your safety is important to us .  If you have drug allergies check your prescription carefully.    You can use MyChart to ask questions about todays visit, request a non-urgent call back, or ask for a work or school excuse for 24 hours related to this e-Visit. If it has been greater than 24 hours you will need to follow up with your provider, or enter a new e-Visit to address those concerns.  You will get an e-mail in the next two days asking about your experience.  I hope that your e-visit has been valuable and will speed your recovery. Thank you for using e-visits.   I have spent 5 minutes in review of e-visit questionnaire,  review and updating patient chart, medical decision making and response to patient.   Lauraine Kitty, FNP

## 2024-09-03 ENCOUNTER — Telehealth: Admitting: Physician Assistant

## 2024-09-03 DIAGNOSIS — B9689 Other specified bacterial agents as the cause of diseases classified elsewhere: Secondary | ICD-10-CM

## 2024-09-03 MED ORDER — DOXYCYCLINE HYCLATE 100 MG PO TABS: 100.0000 mg | ORAL_TABLET | Freq: Two times a day (BID) | ORAL | 0 refills | Status: AC

## 2024-09-03 MED ORDER — PROMETHAZINE-DM 6.25-15 MG/5ML PO SYRP: 5.0000 mL | ORAL_SOLUTION | Freq: Four times a day (QID) | ORAL | 0 refills | Status: AC | PRN

## 2024-09-03 NOTE — Progress Notes (Signed)
 We are sorry that you are not feeling well.  Here is how we plan to help!  Based on your presentation I believe you most likely have A cough due to bacteria.  When patients have a fever and a productive cough with a change in color or increased sputum production, we are concerned about bacterial bronchitis.  If left untreated it can progress to pneumonia.  If your symptoms do not improve with your treatment plan it is important that you contact your provider.   I have prescribed Doxycycline  100 mg twice a day for 7 days     In addition, I have prescribed promethazine -DM cough syrup to use as directed.  From your responses in the eVisit questionnaire you describe inflammation in the upper respiratory tract which is causing a significant cough.  This is commonly called Bronchitis and has four common causes:   Allergies Viral Infections Acid Reflux Bacterial Infection Allergies, viruses and acid reflux are treated by controlling symptoms or eliminating the cause. An example might be a cough caused by taking certain blood pressure medications. You stop the cough by changing the medication. Another example might be a cough caused by acid reflux. Controlling the reflux helps control the cough.  USE OF BRONCHODILATOR (RESCUE) INHALERS: There is a risk from using your bronchodilator too frequently.  The risk is that over-reliance on a medication which only relaxes the muscles surrounding the breathing tubes can reduce the effectiveness of medications prescribed to reduce swelling and congestion of the tubes themselves.  Although you feel brief relief from the bronchodilator inhaler, your asthma may actually be worsening with the tubes becoming more swollen and filled with mucus.  This can delay other crucial treatments, such as oral steroid medications. If you need to use a bronchodilator inhaler daily, several times per day, you should discuss this with your provider.  There are probably better treatments  that could be used to keep your asthma under control.     HOME CARE Only take medications as instructed by your medical team. Complete the entire course of an antibiotic. Drink plenty of fluids and get plenty of rest. Avoid close contacts especially the very young and the elderly Cover your mouth if you cough or cough into your sleeve. Always remember to wash your hands A steam or ultrasonic humidifier can help congestion.   GET HELP RIGHT AWAY IF: You develop worsening fever. You become short of breath You cough up blood. Your symptoms persist after you have completed your treatment plan MAKE SURE YOU  Understand these instructions. Will watch your condition. Will get help right away if you are not doing well or get worse.  Your e-visit answers were reviewed by a board certified advanced clinical practitioner to complete your personal care plan.  Depending on the condition, your plan could have included both over the counter or prescription medications. If there is a problem please reply  once you have received a response from your provider. Your safety is important to us .  If you have drug allergies check your prescription carefully.    You can use MyChart to ask questions about todays visit, request a non-urgent call back, or ask for a work or school excuse for 24 hours related to this e-Visit. If it has been greater than 24 hours you will need to follow up with your provider, or enter a new e-Visit to address those concerns. You will get an e-mail in the next two days asking about your experience.  I  hope that your e-visit has been valuable and will speed your recovery. Thank you for using e-visits.   I have spent 5 minutes in review of e-visit questionnaire, review and updating patient chart, medical decision making and response to patient.   [name], PA-C

## 2024-09-06 ENCOUNTER — Emergency Department (HOSPITAL_BASED_OUTPATIENT_CLINIC_OR_DEPARTMENT_OTHER)
Admission: EM | Admit: 2024-09-06 | Discharge: 2024-09-06 | Disposition: A | Discharge status: Home / Self Care | Attending: Emergency Medicine | Admitting: Emergency Medicine

## 2024-09-06 ENCOUNTER — Ambulatory Visit

## 2024-09-06 ENCOUNTER — Encounter (HOSPITAL_BASED_OUTPATIENT_CLINIC_OR_DEPARTMENT_OTHER): Payer: Self-pay

## 2024-09-06 ENCOUNTER — Other Ambulatory Visit: Payer: Self-pay

## 2024-09-06 ENCOUNTER — Telehealth: Admitting: Physician Assistant

## 2024-09-06 DIAGNOSIS — S0990XA Unspecified injury of head, initial encounter: Secondary | ICD-10-CM

## 2024-09-06 DIAGNOSIS — W228XXA Striking against or struck by other objects, initial encounter: Secondary | ICD-10-CM | POA: Diagnosis not present

## 2024-09-06 DIAGNOSIS — S060X0A Concussion without loss of consciousness, initial encounter: Secondary | ICD-10-CM | POA: Insufficient documentation

## 2024-09-06 DIAGNOSIS — H538 Other visual disturbances: Secondary | ICD-10-CM

## 2024-09-06 NOTE — ED Notes (Addendum)
 Reviewed discharge instructions and follow up. Pt states understanding. NAD Ambulatory at discharge

## 2024-09-06 NOTE — ED Provider Notes (Signed)
 " [facility] Provider Note   CSN: [id number] Arrival date & time: 09/06/24  1012     Patient presents with: No chief complaint on file.   Zachary Riley is a 59 y.o. male.   Pt is a 59 yo male with no significant pmhx.  Pt hit his head on the bathroom door this am around 0500. He had some blurry vision when he got to work.  It is better now.  He denies h/a or n/v.  No dizziness.  He did call pcp who told him to come in for eval.  He is able to ambulate without difficulty.       Prior to Admission medications  Medication Sig Start Date End Date Taking? Authorizing Provider  benzonatate  (TESSALON ) 100 MG capsule Take 1 capsule (100 mg total) by mouth 3 (three) times daily as needed. 08/29/24   Kennyth Domino, FNP  doxycycline  (VIBRA -TABS) 100 MG tablet Take 1 tablet (100 mg total) by mouth 2 (two) times daily. 09/03/24   Gladis Elsie BROCKS, PA-C  fluticasone  (FLONASE ) 50 MCG/ACT nasal spray Place 2 sprays into both nostrils daily. 01/30/24   Vivienne Delon HERO, PA-C  lidocaine  (XYLOCAINE ) 2 % solution Use as directed 15 mLs in the mouth or throat every 4 (four) hours as needed (sore throat). Gargle and spit 08/29/24   Kennyth Domino, FNP  ondansetron  (ZOFRAN -ODT) 4 MG disintegrating tablet Take 1 tablet (4 mg total) by mouth every 8 (eight) hours as needed for nausea or vomiting. 07/25/24   Gladis Elsie BROCKS, PA-C  promethazine -dextromethorphan (PROMETHAZINE -DM) 6.25-15 MG/5ML syrup Take 5 mLs by mouth 4 (four) times daily as needed for cough. 09/03/24   Gladis Elsie BROCKS, PA-C    Allergies: Esomeprazole magnesium and Penicillins    Review of Systems  Eyes:  Positive for visual disturbance.  All other systems reviewed and are negative.   Updated Vital Signs BP (!) 138/98   Pulse 71   Temp 97.7 F (36.5 C) (Oral)   Resp 18   Ht 5' 7 (1.702 m)   Wt 74.8 kg   SpO2 99%   BMI 25.84 kg/m   Physical Exam Vitals and nursing note reviewed.   Constitutional:      Appearance: Normal appearance.  HENT:     Head: Normocephalic.     Comments: Small contusion to forehead    Right Ear: External ear normal.     Left Ear: External ear normal.     Nose: Nose normal.     Mouth/Throat:     Mouth: Mucous membranes are moist.     Pharynx: Oropharynx is clear.  Eyes:     Extraocular Movements: Extraocular movements intact.     Conjunctiva/sclera: Conjunctivae normal.     Pupils: Pupils are equal, round, and reactive to light.  Cardiovascular:     Rate and Rhythm: Normal rate and regular rhythm.     Pulses: Normal pulses.     Heart sounds: Normal heart sounds.  Pulmonary:     Effort: Pulmonary effort is normal.     Breath sounds: Normal breath sounds.  Abdominal:     General: Abdomen is flat. Bowel sounds are normal.     Palpations: Abdomen is soft.  Musculoskeletal:        General: Normal range of motion.     Cervical back: Normal range of motion and neck supple.  Skin:    General: Skin is warm.     Capillary Refill: Capillary  refill takes less than 2 seconds.  Neurological:     General: No focal deficit present.     Mental Status: He is alert and oriented to person, place, and time.  Psychiatric:        Mood and Affect: Mood normal.        Behavior: Behavior normal.     (all labs ordered are listed, but only abnormal results are displayed) Labs Reviewed - No data to display  EKG: None  Radiology: No results found.   Procedures   Medications Ordered in the ED - No data to display                                  Medical Decision Making  This patient presents to the ED for concern of blurry vision, this involves an extensive number of treatment options, and is a complaint that carries with it a high risk of complications and morbidity.  The differential diagnosis includes concussion, head injury, eye injury   Co morbidities that complicate the patient evaluation  none   Additional history  obtained:  Additional history obtained from epic chart review   Medicines ordered and prescription drug management:  I have reviewed the patients home medicines and have made adjustments as needed   Test Considered:  ct   Problem List / ED Course:  Blurry vision after head injury:  improving now.  Pt offered CT, but he declined.  Pt likely had a mild concussion.  He is stable for d/c.  Return if worse.   Reevaluation:  After the interventions noted above, I reevaluated the patient and found that they have :improved   Social Determinants of Health:  Lives at home   Dispostion:  After consideration of the diagnostic results and the patients response to treatment, I feel that the patent would benefit from discharge with outpatient f/u.       Final diagnoses:  Concussion without loss of consciousness, initial encounter    ED Discharge Orders     None          Dean Clarity, MD 09/06/24 1234  "

## 2024-09-06 NOTE — ED Triage Notes (Addendum)
 Pt states that he hit his head on a door this morning. States that he walked right into it states that he went to work today. Noted some blurry vision. Called PCP and they told him go to ED for further evaluation. No LOC. Pt upbeat and laughing in triage. Passed visual acuity in triage.

## 2024-09-06 NOTE — Progress Notes (Signed)

## 2024-09-11 ENCOUNTER — Ambulatory Visit: Payer: Self-pay

## 2024-09-11 NOTE — Telephone Encounter (Signed)
" ° ° °  Summary: Red Word Triage: Blurry Vision, Light Sensativity, Chronic on going Headache   Reason for Triage: Patient called in stating that he hurt himself last week and was diagnosed with having a concussion at the ER on 09/06/2024. Patient states that after being released from the ER, he has been having on going ringing in both ears, headaches, light sensitivity, and blurry vision. Patient is nervous and wants to know how he should proceed next.         Reason for Disposition  [1] Concussion symptoms staying SAME (not worse or better) AND [2] present > 3 days  Answer Assessment - Initial Assessment Questions 1. SYMPTOMS: What symptom are you most concerned about?     Lights at work hurts eyes, pt still having some blurry vision, still has ringing in ear 2. OTHER SYMPTOMS: Do you have any other symptoms? (e.g., nausea, difficulty concentrating)     denies 3. ONSET / PATTERN:  Are you the same, getting better, or getting worse?  What's changed?     1/15, most s/s are improving some are not 5. mTBI: When did your head injury happen? How did it occur?      1/15, hit head on door, no LOC 6. mTBI DIAGNOSIS:  Who diagnosed your concussion?     ED, no PCP 7. mTBI TESTING: Did you have a CT scan or MRI of your head?     denies 8. mTBI LAST VISIT:  When were you seen for your head injury?     1/15 10. FOLLOW-UP APPT:  Do you have a follow-up appointment?       Denies, does not have a PCP  Pt does not have PCP, new pt appt was setup, pt advised to utilize UC/ED until new pt appt.  Protocols used: Concussion (mTBI) Less Than 14 Days Ago Follow-up Call-[facility]  "

## 2025-02-14 ENCOUNTER — Ambulatory Visit: Admitting: Family Medicine
